# Patient Record
Sex: Female | Born: 1975 | Race: Black or African American | Hispanic: No | Marital: Married | Smoking: Current every day smoker
Health system: Southern US, Community
[De-identification: ages and names within clinical notes are randomized; demographics above are authoritative.]

## PROBLEM LIST (undated history)

## (undated) DIAGNOSIS — T7840XA Allergy, unspecified, initial encounter: Secondary | ICD-10-CM

## (undated) DIAGNOSIS — I639 Cerebral infarction, unspecified: Secondary | ICD-10-CM

## (undated) DIAGNOSIS — N83209 Unspecified ovarian cyst, unspecified side: Secondary | ICD-10-CM

## (undated) DIAGNOSIS — R011 Cardiac murmur, unspecified: Secondary | ICD-10-CM

## (undated) DIAGNOSIS — D219 Benign neoplasm of connective and other soft tissue, unspecified: Secondary | ICD-10-CM

## (undated) DIAGNOSIS — F419 Anxiety disorder, unspecified: Secondary | ICD-10-CM

## (undated) DIAGNOSIS — I10 Essential (primary) hypertension: Secondary | ICD-10-CM

## (undated) DIAGNOSIS — R7303 Prediabetes: Secondary | ICD-10-CM

## (undated) DIAGNOSIS — D649 Anemia, unspecified: Secondary | ICD-10-CM

## (undated) DIAGNOSIS — R519 Headache, unspecified: Secondary | ICD-10-CM

## (undated) DIAGNOSIS — Z8489 Family history of other specified conditions: Secondary | ICD-10-CM

## (undated) HISTORY — DX: Unspecified ovarian cyst, unspecified side: N83.209

## (undated) HISTORY — DX: Allergy, unspecified, initial encounter: T78.40XA

## (undated) HISTORY — DX: Benign neoplasm of connective and other soft tissue, unspecified: D21.9

## (undated) HISTORY — PX: TUBAL LIGATION: SHX77

## (undated) HISTORY — PX: CHOLECYSTECTOMY: SHX55

## (undated) HISTORY — DX: Headache, unspecified: R51.9

---

## 1997-09-12 ENCOUNTER — Ambulatory Visit (HOSPITAL_COMMUNITY): Admission: RE | Admit: 1997-09-12 | Discharge: 1997-09-12 | Payer: Self-pay | Admitting: *Deleted

## 1997-11-28 ENCOUNTER — Inpatient Hospital Stay (HOSPITAL_COMMUNITY): Admission: AD | Admit: 1997-11-28 | Discharge: 1997-11-28 | Payer: Self-pay | Admitting: *Deleted

## 1997-11-28 ENCOUNTER — Inpatient Hospital Stay (HOSPITAL_COMMUNITY): Admission: AD | Admit: 1997-11-28 | Discharge: 1997-11-30 | Payer: Self-pay | Admitting: *Deleted

## 2001-02-28 ENCOUNTER — Other Ambulatory Visit: Admission: RE | Admit: 2001-02-28 | Discharge: 2001-02-28 | Payer: Self-pay | Admitting: Family Medicine

## 2002-01-18 ENCOUNTER — Emergency Department (HOSPITAL_COMMUNITY): Admission: EM | Admit: 2002-01-18 | Discharge: 2002-01-18 | Payer: Self-pay | Admitting: Internal Medicine

## 2002-01-18 ENCOUNTER — Encounter: Payer: Self-pay | Admitting: Emergency Medicine

## 2002-04-18 ENCOUNTER — Ambulatory Visit (HOSPITAL_COMMUNITY): Admission: RE | Admit: 2002-04-18 | Discharge: 2002-04-18 | Payer: Self-pay | Admitting: Internal Medicine

## 2002-04-18 ENCOUNTER — Encounter: Payer: Self-pay | Admitting: Internal Medicine

## 2002-06-20 ENCOUNTER — Encounter: Payer: Self-pay | Admitting: Internal Medicine

## 2002-06-20 ENCOUNTER — Encounter (HOSPITAL_COMMUNITY): Admission: RE | Admit: 2002-06-20 | Discharge: 2002-07-20 | Payer: Self-pay | Admitting: Internal Medicine

## 2005-02-26 ENCOUNTER — Emergency Department (HOSPITAL_COMMUNITY): Admission: EM | Admit: 2005-02-26 | Discharge: 2005-02-26 | Payer: Self-pay | Admitting: Emergency Medicine

## 2008-10-01 ENCOUNTER — Ambulatory Visit (HOSPITAL_COMMUNITY): Admission: RE | Admit: 2008-10-01 | Discharge: 2008-10-01 | Payer: Self-pay | Admitting: Family Medicine

## 2008-10-02 ENCOUNTER — Encounter (HOSPITAL_COMMUNITY): Admission: RE | Admit: 2008-10-02 | Discharge: 2008-11-01 | Payer: Self-pay | Admitting: Family Medicine

## 2008-10-12 ENCOUNTER — Emergency Department (HOSPITAL_COMMUNITY): Admission: EM | Admit: 2008-10-12 | Discharge: 2008-10-12 | Payer: Self-pay | Admitting: Emergency Medicine

## 2008-10-24 ENCOUNTER — Encounter: Admission: RE | Admit: 2008-10-24 | Discharge: 2008-10-24 | Payer: Self-pay | Admitting: General Surgery

## 2009-07-28 ENCOUNTER — Emergency Department (HOSPITAL_COMMUNITY): Admission: EM | Admit: 2009-07-28 | Discharge: 2009-07-28 | Payer: Self-pay | Admitting: Emergency Medicine

## 2010-02-10 DIAGNOSIS — I639 Cerebral infarction, unspecified: Secondary | ICD-10-CM

## 2010-02-10 HISTORY — DX: Cerebral infarction, unspecified: I63.9

## 2010-02-17 ENCOUNTER — Ambulatory Visit (HOSPITAL_COMMUNITY)
Admission: RE | Admit: 2010-02-17 | Discharge: 2010-02-17 | Payer: Self-pay | Source: Home / Self Care | Admitting: General Surgery

## 2010-07-18 ENCOUNTER — Ambulatory Visit (HOSPITAL_COMMUNITY)
Admission: RE | Admit: 2010-07-18 | Discharge: 2010-07-18 | Payer: Self-pay | Source: Home / Self Care | Attending: Internal Medicine | Admitting: Internal Medicine

## 2010-08-08 ENCOUNTER — Other Ambulatory Visit: Payer: Self-pay | Admitting: Neurology

## 2010-08-08 DIAGNOSIS — R200 Anesthesia of skin: Secondary | ICD-10-CM

## 2010-08-15 ENCOUNTER — Other Ambulatory Visit: Payer: Self-pay | Admitting: Neurology

## 2010-08-15 ENCOUNTER — Ambulatory Visit (HOSPITAL_COMMUNITY)
Admission: RE | Admit: 2010-08-15 | Discharge: 2010-08-15 | Disposition: A | Discharge status: Home / Self Care | Payer: PRIVATE HEALTH INSURANCE | Source: Ambulatory Visit | Attending: Neurology | Admitting: Neurology

## 2010-08-15 DIAGNOSIS — R42 Dizziness and giddiness: Secondary | ICD-10-CM | POA: Insufficient documentation

## 2010-08-15 DIAGNOSIS — R200 Anesthesia of skin: Secondary | ICD-10-CM

## 2010-08-15 DIAGNOSIS — I6529 Occlusion and stenosis of unspecified carotid artery: Secondary | ICD-10-CM | POA: Insufficient documentation

## 2010-08-15 DIAGNOSIS — I10 Essential (primary) hypertension: Secondary | ICD-10-CM | POA: Insufficient documentation

## 2010-08-15 DIAGNOSIS — Z87891 Personal history of nicotine dependence: Secondary | ICD-10-CM | POA: Insufficient documentation

## 2010-08-15 DIAGNOSIS — R209 Unspecified disturbances of skin sensation: Secondary | ICD-10-CM | POA: Insufficient documentation

## 2010-08-15 DIAGNOSIS — I6789 Other cerebrovascular disease: Secondary | ICD-10-CM

## 2010-09-26 LAB — CBC
HCT: 32.8 % — ABNORMAL LOW (ref 36.0–46.0)
Hemoglobin: 10.4 g/dL — ABNORMAL LOW (ref 12.0–15.0)
MCHC: 31.8 g/dL (ref 30.0–36.0)
MCV: 71.3 fL — ABNORMAL LOW (ref 78.0–100.0)
RDW: 17.7 % — ABNORMAL HIGH (ref 11.5–15.5)

## 2010-09-26 LAB — BASIC METABOLIC PANEL
BUN: 10 mg/dL (ref 6–23)
CO2: 26 mEq/L (ref 19–32)
Chloride: 108 mEq/L (ref 96–112)
Glucose, Bld: 89 mg/dL (ref 70–99)
Potassium: 4.3 mEq/L (ref 3.5–5.1)
Sodium: 138 mEq/L (ref 135–145)

## 2010-09-29 LAB — DIFFERENTIAL
Basophils Absolute: 0 K/uL (ref 0.0–0.1)
Basophils Relative: 0 % (ref 0–1)
Eosinophils Absolute: 0 K/uL (ref 0.0–0.7)
Eosinophils Relative: 0 % (ref 0–5)
Lymphocytes Relative: 20 % (ref 12–46)
Lymphs Abs: 1.4 K/uL (ref 0.7–4.0)
Monocytes Absolute: 0.3 K/uL (ref 0.1–1.0)
Monocytes Relative: 4 % (ref 3–12)
Neutro Abs: 5.6 K/uL (ref 1.7–7.7)
Neutrophils Relative %: 76 % (ref 43–77)

## 2010-09-29 LAB — COMPREHENSIVE METABOLIC PANEL WITH GFR
ALT: 19 U/L (ref 0–35)
AST: 42 U/L — ABNORMAL HIGH (ref 0–37)
Albumin: 4.1 g/dL (ref 3.5–5.2)
Alkaline Phosphatase: 50 U/L (ref 39–117)
BUN: 9 mg/dL (ref 6–23)
CO2: 23 meq/L (ref 19–32)
Calcium: 9.3 mg/dL (ref 8.4–10.5)
Chloride: 106 meq/L (ref 96–112)
Creatinine, Ser: 0.85 mg/dL (ref 0.4–1.2)
GFR calc non Af Amer: >60 mL/min
Glucose, Bld: 97 mg/dL (ref 70–99)
Potassium: 3.7 meq/L (ref 3.5–5.1)
Sodium: 137 meq/L (ref 135–145)
Total Bilirubin: 0.2 mg/dL — ABNORMAL LOW (ref 0.3–1.2)
Total Protein: 7.7 g/dL (ref 6.0–8.3)

## 2010-09-29 LAB — CBC
HCT: 34.2 % — ABNORMAL LOW (ref 36.0–46.0)
Hemoglobin: 10.7 g/dL — ABNORMAL LOW (ref 12.0–15.0)
MCHC: 31.4 g/dL (ref 30.0–36.0)
MCV: 70.8 fL — ABNORMAL LOW (ref 78.0–100.0)
Platelets: 236 K/uL (ref 150–400)
RBC: 4.83 MIL/uL (ref 3.87–5.11)
RDW: 17 % — ABNORMAL HIGH (ref 11.5–15.5)
WBC: 7.3 K/uL (ref 4.0–10.5)

## 2010-09-29 LAB — LIPASE, BLOOD: Lipase: 23 U/L (ref 11–59)

## 2011-03-06 ENCOUNTER — Emergency Department (HOSPITAL_COMMUNITY): Payer: PRIVATE HEALTH INSURANCE

## 2011-03-06 ENCOUNTER — Encounter: Payer: Self-pay | Admitting: *Deleted

## 2011-03-06 ENCOUNTER — Other Ambulatory Visit: Payer: Self-pay

## 2011-03-06 ENCOUNTER — Emergency Department (HOSPITAL_COMMUNITY)
Admission: EM | Admit: 2011-03-06 | Discharge: 2011-03-06 | Disposition: A | Discharge status: Home / Self Care | Payer: PRIVATE HEALTH INSURANCE | Attending: Emergency Medicine | Admitting: Emergency Medicine

## 2011-03-06 DIAGNOSIS — I10 Essential (primary) hypertension: Secondary | ICD-10-CM | POA: Insufficient documentation

## 2011-03-06 DIAGNOSIS — Z7982 Long term (current) use of aspirin: Secondary | ICD-10-CM | POA: Insufficient documentation

## 2011-03-06 DIAGNOSIS — J40 Bronchitis, not specified as acute or chronic: Secondary | ICD-10-CM | POA: Insufficient documentation

## 2011-03-06 DIAGNOSIS — F17200 Nicotine dependence, unspecified, uncomplicated: Secondary | ICD-10-CM | POA: Insufficient documentation

## 2011-03-06 HISTORY — DX: Essential (primary) hypertension: I10

## 2011-03-06 MED ORDER — ALBUTEROL SULFATE HFA 108 (90 BASE) MCG/ACT IN AERS
2.0000 | INHALATION_SPRAY | Freq: Once | RESPIRATORY_TRACT | Status: AC
  Administered 2011-03-06: 2 via RESPIRATORY_TRACT
  Filled 2011-03-06: qty 6.7

## 2011-03-06 MED ORDER — OXYCODONE-ACETAMINOPHEN 5-325 MG PO TABS
2.0000 | ORAL_TABLET | Freq: Once | ORAL | Status: AC
  Administered 2011-03-06: 2 via ORAL
  Filled 2011-03-06: qty 2

## 2011-03-06 MED ORDER — AZITHROMYCIN 250 MG PO TABS: 250.0000 mg | ORAL_TABLET | Freq: Every day | ORAL | Status: AC

## 2011-03-06 MED ORDER — AZITHROMYCIN 250 MG PO TABS
500.0000 mg | ORAL_TABLET | Freq: Once | ORAL | Status: AC
  Administered 2011-03-06: 500 mg via ORAL
  Filled 2011-03-06: qty 2

## 2011-03-06 NOTE — ED Provider Notes (Signed)
History  Scribed for Dr. Rosalia Hammers, the patient was seen in room 12. The chart was scribed by Gilman Schmidt. The patients care was started at 2142.  CSN: [id number] Arrival date & time: 03/06/2011  7:52 PM  Chief Complaint  Patient presents with  . Chest Pain   HPI Chloe Marks is a 35 y.o. female with a history of hypertension who presents to the Emergency Department complaining of chest pain and a productive cough (yellow sputum) that began yesterday and has worsened today. The patient reports that the chest pain is exacerbated by coughing. There are no other associated symptoms and no other alleviating or aggravating factors.   PAST MEDICAL HISTORY:  Past Medical History  Diagnosis Date  . Hypertension    PAST SURGICAL HISTORY:  Past Surgical History  Procedure Date  . Cholecystectomy    MEDICATIONS:  Previous Medications   ASPIRIN (BAYER LOW STRENGTH) 81 MG EC TABLET    Take 81 mg by mouth daily.     IBUPROFEN (ADVIL,MOTRIN) 200 MG TABLET    Take 400 mg by mouth once as needed. For pain    IRON PO    Take 1 capsule by mouth daily. Poly-Iron Supplement    LISINOPRIL-HYDROCHLOROTHIAZIDE PO    Take 1 tablet by mouth daily.      ALLERGIES:  Allergies as of 03/06/2011 - Review Complete 03/06/2011  Allergen Reaction Noted  . Vicodin (hydrocodone-acetaminophen) Nausea And Vomiting and Other (See Comments) 03/06/2011     FAMILY HISTORY:  No family history on file.   SOCIAL HISTORY: History  Substance Use Topics  . Smoking status: Current Everyday Smoker  . Smokeless tobacco: Not on file  . Alcohol Use: No    Review of Systems  Respiratory: Positive for cough (yellow sputum).   Cardiovascular: Positive for chest pain.    Physical Exam  BP 130/86  Pulse 113  Temp(Src) 98.7 F (37.1 C) (Oral)  Resp 18  Ht 5\' 6"  (1.676 m)  Wt 190 lb (86.183 kg)  BMI 30.67 kg/m2  SpO2 100%  LMP 02/27/2011  Physical Exam  Constitutional: She is oriented to person, place, and time.  She appears well-developed and well-nourished.  HENT:  Head: Normocephalic.  Right Ear: External ear normal.  Left Ear: External ear normal.  Mouth/Throat: Oropharynx is clear and moist.  Eyes: Conjunctivae and EOM are normal. Pupils are equal, round, and reactive to light.  Neck: Normal range of motion. Neck supple.  Cardiovascular: Normal rate, regular rhythm, normal heart sounds and intact distal pulses.   Pulmonary/Chest: Effort normal and breath sounds normal.  Musculoskeletal: Normal range of motion.  Neurological: She is alert and oriented to person, place, and time.  Skin: Skin is warm and dry.  Psychiatric: She has a normal mood and affect. Her behavior is normal.    OTHER DATA REVIEWED: Nursing notes, vital signs, and past medical records reviewed.   DIAGNOSTIC STUDIES: Oxygen Saturation is 100% on room air, normal by my interpretation.    EKG:  Date: 03/06/2011  Dg Chest 2 View  03/06/2011  *RADIOLOGY REPORT*  Clinical Data: Cough.  CHEST - 2 VIEW  Comparison: None.  Findings: Heart and mediastinal contours are within normal limits. No focal opacities or effusions.  No acute bony abnormality.  IMPRESSION: Normal study.  Original Report Authenticated By: Cyndie Chime, M.D.  ED COURSE / COORDINATION OF CARE: 2142  Patient evaluated by ED physician, CXR, EKG ordered. Pulse Recheck- 98  MDM: Patient with chest pain only  with cough.  Patient with chills but no fever.  Cough productive of yellow sputum.    IMPRESSION: Bronchitis- plan z-pack and albuterol.   PLAN:  Discharge: The patient is to return the emergency department if there is any worsening of symptoms. I have reviewed the discharge instructions with the patient.  CONDITION ON DISCHARGE: Stable  MEDICATIONS GIVEN IN THE E.D.  Medications  LISINOPRIL-HYDROCHLOROTHIAZIDE PO (not administered)  aspirin (BAYER LOW STRENGTH) 81 MG EC tablet (not administered)  IRON PO (not administered)  ibuprofen  (ADVIL,MOTRIN) 200 MG tablet (not administered)    DISCHARGE MEDICATIONS: New Prescriptions   No medications on file    ED Course  Procedures       Hilario Quarry, MD 03/07/11 1329

## 2011-03-06 NOTE — ED Notes (Signed)
Pt reports increased productive cough with asso chest wall pain starting yesterday

## 2011-03-26 ENCOUNTER — Other Ambulatory Visit: Payer: Self-pay | Admitting: Neurology

## 2011-03-26 DIAGNOSIS — R221 Localized swelling, mass and lump, neck: Secondary | ICD-10-CM

## 2011-04-02 ENCOUNTER — Other Ambulatory Visit (HOSPITAL_COMMUNITY): Payer: PRIVATE HEALTH INSURANCE

## 2011-04-02 ENCOUNTER — Ambulatory Visit (HOSPITAL_COMMUNITY): Admission: RE | Admit: 2011-04-02 | Payer: PRIVATE HEALTH INSURANCE | Source: Ambulatory Visit

## 2011-06-10 ENCOUNTER — Observation Stay (HOSPITAL_COMMUNITY)
Admission: EM | Admit: 2011-06-10 | Discharge: 2011-06-11 | DRG: 914 | Disposition: A | Discharge status: Home / Self Care | Payer: 59 | Source: Other Acute Inpatient Hospital | Attending: Neurosurgery | Admitting: Neurosurgery

## 2011-06-10 ENCOUNTER — Emergency Department: Payer: Self-pay

## 2011-06-10 ENCOUNTER — Encounter (HOSPITAL_COMMUNITY): Payer: Self-pay | Admitting: *Deleted

## 2011-06-10 DIAGNOSIS — F17200 Nicotine dependence, unspecified, uncomplicated: Secondary | ICD-10-CM | POA: Insufficient documentation

## 2011-06-10 DIAGNOSIS — Y998 Other external cause status: Secondary | ICD-10-CM | POA: Insufficient documentation

## 2011-06-10 DIAGNOSIS — S0990XA Unspecified injury of head, initial encounter: Principal | ICD-10-CM | POA: Diagnosis present

## 2011-06-10 DIAGNOSIS — M542 Cervicalgia: Secondary | ICD-10-CM | POA: Insufficient documentation

## 2011-06-10 HISTORY — DX: Cerebral infarction, unspecified: I63.9

## 2011-06-10 LAB — MRSA PCR SCREENING: MRSA by PCR: NEGATIVE

## 2011-06-10 MED ORDER — LISINOPRIL-HYDROCHLOROTHIAZIDE 10-12.5 MG PO TABS: 1.0000 | ORAL_TABLET | Freq: Every day | ORAL | Status: DC

## 2011-06-10 MED ORDER — ONDANSETRON HCL 4 MG/2ML IJ SOLN: 4.0000 mg | Freq: Four times a day (QID) | INTRAMUSCULAR | Status: DC | PRN

## 2011-06-10 MED ORDER — HYDROCHLOROTHIAZIDE 12.5 MG PO CAPS
12.5000 mg | ORAL_CAPSULE | Freq: Every day | ORAL | Status: DC
  Filled 2011-06-10: qty 1

## 2011-06-10 MED ORDER — LISINOPRIL 10 MG PO TABS
10.0000 mg | ORAL_TABLET | Freq: Every day | ORAL | Status: DC
  Filled 2011-06-10: qty 1

## 2011-06-10 MED ORDER — ONDANSETRON HCL 4 MG PO TABS: 4.0000 mg | ORAL_TABLET | Freq: Four times a day (QID) | ORAL | Status: DC | PRN

## 2011-06-10 MED ORDER — SODIUM CHLORIDE 0.9 % IV SOLN
INTRAVENOUS | Status: DC
  Administered 2011-06-10 – 2011-06-11 (×2): via INTRAVENOUS
  Filled 2011-06-10 (×3): qty 1000

## 2011-06-10 MED ORDER — ACETAMINOPHEN 325 MG PO TABS: 650.0000 mg | ORAL_TABLET | ORAL | Status: DC | PRN

## 2011-06-10 MED ORDER — HYDROCODONE-ACETAMINOPHEN 5-325 MG PO TABS
1.0000 | ORAL_TABLET | ORAL | Status: DC | PRN
  Administered 2011-06-10 – 2011-06-11 (×3): 1 via ORAL
  Filled 2011-06-10 (×4): qty 1
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
## 2011-06-11 DIAGNOSIS — S0990XA Unspecified injury of head, initial encounter: Secondary | ICD-10-CM | POA: Diagnosis present

## 2011-06-11 MED ORDER — HYDROCODONE-ACETAMINOPHEN 5-325 MG PO TABS: 1.0000 | ORAL_TABLET | Freq: Four times a day (QID) | ORAL | Status: AC | PRN

## 2011-06-11 NOTE — Discharge Summary (Signed)
Physician Discharge Summary  Patient ID: DIA DONATE MRN: [id number] DOB/AGE: 1975/11/23 35 y.o.  Admit date: 06/10/2011 Discharge date: 06/11/2011  Admission Diagnoses:Closed Head Injury    Discharge Diagnoses: Same Active Problems:  * No active hospital problems. *    Discharged Condition: good  Hospital Course: Overnight stay. Neurologically normal. GCS 15. Complains of mild headache, improved since admission.  Consults: none  Significant Diagnostic Studies: none  Treatments: observation  Discharge Exam: Blood pressure 107/69, pulse 63, temperature 98.3 F (36.8 C), temperature source Oral, resp. rate 18, height 5\' 7"  (1.702 m), weight 84.4 kg (186 lb 1.1 oz), last menstrual period 06/10/2011, SpO2 100.00%. Head: Normocephalic, without obvious abnormality, atraumatic Eyes: conjunctivae/corneas clear. PERRL, EOM's intact. Fundi benign. Neurologic: Alert and oriented X 3, normal strength and tone. Normal symmetric reflexes. Normal coordination and gait  Disposition: Home or Self Care   Current Discharge Medication List    START taking these medications   Details  HYDROcodone-acetaminophen (NORCO) 5-325 MG per tablet Take 1 tablet by mouth every 6 (six) hours as needed for pain. Qty: 40 tablet, Refills: 0      CONTINUE these medications which have NOT CHANGED   Details  aspirin (BAYER LOW STRENGTH) 81 MG EC tablet Take 81 mg by mouth daily.      IRON PO Take 1 capsule by mouth daily. Poly-Iron Supplement     lisinopril-hydrochlorothiazide (PRINZIDE,ZESTORETIC) 10-12.5 MG per tablet Take 1 tablet by mouth daily.        STOP taking these medications     ibuprofen (ADVIL,MOTRIN) 200 MG tablet          Signed: Dorrien Grunder L 06/11/2011, 1:13 PM

## 2011-10-20 ENCOUNTER — Other Ambulatory Visit: Payer: Self-pay | Admitting: Neurology

## 2011-10-20 DIAGNOSIS — I639 Cerebral infarction, unspecified: Secondary | ICD-10-CM

## 2011-10-21 ENCOUNTER — Ambulatory Visit (HOSPITAL_COMMUNITY)
Admission: RE | Admit: 2011-10-21 | Discharge: 2011-10-21 | Disposition: A | Discharge status: Home / Self Care | Payer: 59 | Source: Ambulatory Visit | Attending: Neurology | Admitting: Neurology

## 2011-10-21 DIAGNOSIS — I6350 Cerebral infarction due to unspecified occlusion or stenosis of unspecified cerebral artery: Secondary | ICD-10-CM | POA: Insufficient documentation

## 2011-10-21 DIAGNOSIS — I639 Cerebral infarction, unspecified: Secondary | ICD-10-CM

## 2011-10-21 DIAGNOSIS — I10 Essential (primary) hypertension: Secondary | ICD-10-CM | POA: Insufficient documentation

## 2012-01-30 ENCOUNTER — Encounter (HOSPITAL_COMMUNITY): Payer: Self-pay | Admitting: *Deleted

## 2012-01-30 ENCOUNTER — Emergency Department (HOSPITAL_COMMUNITY)
Admission: EM | Admit: 2012-01-30 | Discharge: 2012-01-30 | Disposition: A | Discharge status: Home / Self Care | Payer: 59 | Attending: Emergency Medicine | Admitting: Emergency Medicine

## 2012-01-30 DIAGNOSIS — I10 Essential (primary) hypertension: Secondary | ICD-10-CM | POA: Insufficient documentation

## 2012-01-30 DIAGNOSIS — R5381 Other malaise: Secondary | ICD-10-CM | POA: Insufficient documentation

## 2012-01-30 DIAGNOSIS — N898 Other specified noninflammatory disorders of vagina: Secondary | ICD-10-CM | POA: Insufficient documentation

## 2012-01-30 DIAGNOSIS — R109 Unspecified abdominal pain: Secondary | ICD-10-CM | POA: Insufficient documentation

## 2012-01-30 DIAGNOSIS — N949 Unspecified condition associated with female genital organs and menstrual cycle: Secondary | ICD-10-CM | POA: Insufficient documentation

## 2012-01-30 DIAGNOSIS — R300 Dysuria: Secondary | ICD-10-CM | POA: Insufficient documentation

## 2012-01-30 DIAGNOSIS — R52 Pain, unspecified: Secondary | ICD-10-CM | POA: Insufficient documentation

## 2012-01-30 DIAGNOSIS — R102 Pelvic and perineal pain: Secondary | ICD-10-CM

## 2012-01-30 LAB — URINALYSIS, ROUTINE W REFLEX MICROSCOPIC
Bilirubin Urine: NEGATIVE
Glucose, UA: NEGATIVE mg/dL
Hgb urine dipstick: NEGATIVE
Protein, ur: NEGATIVE mg/dL
Urobilinogen, UA: 0.2 mg/dL (ref 0.0–1.0)

## 2012-01-30 LAB — WET PREP, GENITAL: Yeast Wet Prep HPF POC: NONE SEEN

## 2012-01-30 MED ORDER — AZITHROMYCIN 250 MG PO TABS
1000.0000 mg | ORAL_TABLET | Freq: Once | ORAL | Status: AC
  Administered 2012-01-30: 1000 mg via ORAL
  Filled 2012-01-30: qty 4

## 2012-01-30 MED ORDER — OXYCODONE-ACETAMINOPHEN 5-325 MG PO TABS
1.0000 | ORAL_TABLET | Freq: Once | ORAL | Status: AC
  Administered 2012-01-30: 1 via ORAL
  Filled 2012-01-30 (×2): qty 1

## 2012-01-30 MED ORDER — OXYCODONE-ACETAMINOPHEN 5-325 MG PO TABS: 1.0000 | ORAL_TABLET | ORAL | Status: AC | PRN

## 2012-01-30 MED ORDER — LIDOCAINE HCL (PF) 1 % IJ SOLN
INTRAMUSCULAR | Status: AC
  Administered 2012-01-30: 2.1 mL
  Filled 2012-01-30: qty 5

## 2012-01-30 MED ORDER — CEFTRIAXONE SODIUM 250 MG IJ SOLR
250.0000 mg | Freq: Once | INTRAMUSCULAR | Status: AC
  Administered 2012-01-30: 250 mg via INTRAMUSCULAR
  Filled 2012-01-30: qty 250

## 2012-01-30 NOTE — ED Notes (Addendum)
Pt reporting pain with urination.  States has had for about 2 weeks, but pain getting worse.  Reports mild nausea, no vomiting. Also reporting vaginal discharge.

## 2012-01-30 NOTE — ED Notes (Signed)
Discharge instructions given and reviewed with patient.  Prescription given for Percocet; effects and use explained.  Patient verbalized understanding to take medication as prescribed and sedating effects.  Patient also verbalized understanding to return tomorrow morning for ultrasound scheduled at 9am.

## 2012-01-30 NOTE — ED Provider Notes (Signed)
History  This chart was scribed for Flint Melter, MD by Erskine Emery. This patient was seen in room APA08/APA08 and the patient's care was started at 20:47.   CSN: [id number]  Arrival date & time 01/30/12  2002   First MD Initiated Contact with Patient 01/30/12 2047      No chief complaint on file.   (Consider location/radiation/quality/duration/timing/severity/associated sxs/prior treatment) HPI  Chloe Marks is a 36 y.o. female who presents to the Emergency Department complaining of moderate dysuria for the past 2 weeks that has been gradually worsening with associated vaginal discharge, weakness, and mild abdominal pain. Pt denies any current fever, back pain, nausea, or emesis but reports some nausea and vertigo about a week ago. Pt's LNMP was last week. Pt denies she has any concern for of STD.  Pt reports her mother drove her to the ED.    Past Medical History  Diagnosis Date  . Hypertension   . 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 Cholecystectomy   . Tubal ligation     History reviewed. No pertinent family history.  History  Substance Use Topics  . Smoking status: Current Everyday Smoker -- 0.2 packs/day for 4 years    Types: Cigarettes  . Smokeless tobacco: Not on file  . Alcohol Use: No    OB History    Grav Para Term Preterm Abortions TAB SAB Ect Mult Living                  Review of Systems  Constitutional: Negative for fever and chills.  Respiratory: Negative for cough and shortness of breath.   Gastrointestinal: Positive for abdominal pain. Negative for nausea and vomiting.  Genitourinary: Positive for dysuria and vaginal discharge. Negative for vaginal bleeding.  Neurological: Positive for weakness.    Allergies  Vicodin  Home Medications   Current Outpatient Rx  Name Route Sig Dispense Refill  . ASPIRIN 81 MG PO TBEC Oral Take 81 mg by mouth daily.      Marland Kitchen POLYSACCHARIDE  IRON COMPLEX 150 MG PO CAPS Oral Take 150 mg by mouth daily.    Marland Kitchen LISINOPRIL-HYDROCHLOROTHIAZIDE 10-12.5 MG PO TABS Oral Take 1 tablet by mouth daily.      Marland Kitchen MECLIZINE HCL 25 MG PO TABS Oral Take 25 mg by mouth as needed. Dizziness    . OXYCODONE-ACETAMINOPHEN 5-325 MG PO TABS Oral Take 1 tablet by mouth every 4 (four) hours as needed for pain. 15 tablet 0    Triage Vitals: BP 120/73  Pulse 72  Temp 98 F (36.7 C) (Oral)  Resp 18  Ht 5\' 7"  (1.702 m)  Wt 181 lb (82.101 kg)  BMI 28.35 kg/m2  SpO2 100%  LMP 01/23/2012  Physical Exam  Nursing note and vitals reviewed. Constitutional: She is oriented to person, place, and time. She appears well-developed and well-nourished.  HENT:  Head: Normocephalic and atraumatic.  Mouth/Throat: Oropharynx is clear and moist.  Eyes: Conjunctivae and EOM are normal.  Neck: Phonation normal.  Cardiovascular: Normal rate, regular rhythm and normal heart sounds.   No murmur heard. Pulmonary/Chest: Effort normal and breath sounds normal. She exhibits no bony tenderness.  Abdominal: Soft. Normal appearance and bowel sounds are normal. There is tenderness.       Moderate RLQ tenderness  Genitourinary: Uterus is tender. Uterus is not enlarged. Cervix exhibits friability. Cervix exhibits no motion tenderness. Discharge: white. Right adnexum displays  tenderness. Right adnexum displays no mass. Left adnexum displays no mass and no tenderness. Vaginal discharge (white) found.       Small amount of white vaginal discharge. Unable to palpate either ovary.    Musculoskeletal: Normal range of motion.       No CVAT  Neurological: She is alert and oriented to person, place, and time. She has normal strength. No cranial nerve deficit or sensory deficit. She exhibits normal muscle tone. Coordination normal.  Skin: Skin is warm, dry and intact.  Psychiatric: She has a normal mood and affect. Her behavior is normal. Judgment and thought content normal.    ED Course    Procedures (including critical care time)  DIAGNOSTIC STUDIES: Oxygen Saturation is 100% on room air, normal by my interpretation.    COORDINATION OF CARE: 20:55--I evaluated the patient and we discussed a treatment plan including a pelvic exam and some pain medication to which the pt agreed.   21:00--Medication order: oxycodone-acetaminophen (Percocet/Roxicet) 5-325 mg per tablet, 1 tablet--once  21:40--I performed a pelvic examination and notified the pt that her symptoms may be due to an STD and that I would prescribe her some Zithromax and Rocephin. I let her know that we would run tests and notify her of the results in a few days.   Labs Reviewed  URINALYSIS, ROUTINE W REFLEX MICROSCOPIC - Abnormal; Notable for the following:    APPearance HAZY (*)     All other components within normal limits  WET PREP, GENITAL - Abnormal; Notable for the following:    Trich, Wet Prep MANY (*)     Clue Cells Wet Prep HPF POC MANY (*)     WBC, Wet Prep HPF POC MANY (*)     All other components within normal limits  PREGNANCY, URINE  GC/CHLAMYDIA PROBE AMP, GENITAL   No results found.   1. Acute pelvic pain, female       MDM   Nonspecific pelvic pain. Patient has been treated for STD. Doubt PID, or systemic illness. Possible right ovarian process, they can be evaluated as an outpatient with an ultrasound.    Plan: Home Medications- Percocet; Home Treatments- rest; Recommended follow up- U/S in morning  I personally performed the services described in this documentation, which was scribed in my presence. The recorded information has been reviewed and considered.           Flint Melter, MD 01/31/12 [phone]

## 2012-01-31 ENCOUNTER — Ambulatory Visit (HOSPITAL_COMMUNITY)
Admit: 2012-01-31 | Discharge: 2012-01-31 | Disposition: A | Discharge status: Home / Self Care | Payer: 59 | Source: Ambulatory Visit | Attending: Emergency Medicine | Admitting: Emergency Medicine

## 2012-01-31 ENCOUNTER — Other Ambulatory Visit (HOSPITAL_COMMUNITY): Payer: Self-pay | Admitting: Emergency Medicine

## 2012-01-31 DIAGNOSIS — R102 Pelvic and perineal pain: Secondary | ICD-10-CM

## 2012-01-31 DIAGNOSIS — N949 Unspecified condition associated with female genital organs and menstrual cycle: Secondary | ICD-10-CM | POA: Insufficient documentation

## 2012-01-31 DIAGNOSIS — R1031 Right lower quadrant pain: Secondary | ICD-10-CM | POA: Insufficient documentation

## 2012-01-31 DIAGNOSIS — R9389 Abnormal findings on diagnostic imaging of other specified body structures: Secondary | ICD-10-CM | POA: Insufficient documentation

## 2013-02-13 ENCOUNTER — Emergency Department (HOSPITAL_COMMUNITY)
Admission: EM | Admit: 2013-02-13 | Discharge: 2013-02-13 | Disposition: A | Discharge status: Home / Self Care | Payer: Self-pay | Attending: Emergency Medicine | Admitting: Emergency Medicine

## 2013-02-13 ENCOUNTER — Encounter (HOSPITAL_COMMUNITY): Payer: Self-pay | Admitting: Emergency Medicine

## 2013-02-13 DIAGNOSIS — S51812A Laceration without foreign body of left forearm, initial encounter: Secondary | ICD-10-CM

## 2013-02-13 DIAGNOSIS — Y929 Unspecified place or not applicable: Secondary | ICD-10-CM | POA: Insufficient documentation

## 2013-02-13 DIAGNOSIS — W268XXA Contact with other sharp object(s), not elsewhere classified, initial encounter: Secondary | ICD-10-CM | POA: Insufficient documentation

## 2013-02-13 DIAGNOSIS — F17200 Nicotine dependence, unspecified, uncomplicated: Secondary | ICD-10-CM | POA: Insufficient documentation

## 2013-02-13 DIAGNOSIS — I10 Essential (primary) hypertension: Secondary | ICD-10-CM | POA: Insufficient documentation

## 2013-02-13 DIAGNOSIS — Z79899 Other long term (current) drug therapy: Secondary | ICD-10-CM | POA: Insufficient documentation

## 2013-02-13 DIAGNOSIS — Z7982 Long term (current) use of aspirin: Secondary | ICD-10-CM | POA: Insufficient documentation

## 2013-02-13 DIAGNOSIS — Z8673 Personal history of transient ischemic attack (TIA), and cerebral infarction without residual deficits: Secondary | ICD-10-CM | POA: Insufficient documentation

## 2013-02-13 DIAGNOSIS — S51809A Unspecified open wound of unspecified forearm, initial encounter: Secondary | ICD-10-CM | POA: Insufficient documentation

## 2013-02-13 DIAGNOSIS — Y9389 Activity, other specified: Secondary | ICD-10-CM | POA: Insufficient documentation

## 2013-02-13 NOTE — ED Notes (Signed)
States that she cut her left arm yesterday around 6 pm, states that she thinks it may need stiches.  States that she cut her arm with glass.

## 2013-02-13 NOTE — ED Provider Notes (Signed)
Medical screening examination/treatment/procedure(s) were performed by non-physician practitioner and as supervising physician I was immediately available for consultation/collaboration.   Stasia Somero, MD 02/13/13 1506 

## 2013-02-13 NOTE — ED Notes (Signed)
Superficial lac to lt forearm , cut on broken glass. Injury occurred yesterday.

## 2013-02-13 NOTE — ED Provider Notes (Signed)
CSN: [id number]     Arrival date & time 02/13/13  1318 History     First MD Initiated Contact with Patient 02/13/13 1333     Chief Complaint  Patient presents with  . Extremity Laceration   (Consider location/radiation/quality/duration/timing/severity/associated sxs/prior Treatment) HPI Comments: Chloe Marks is a 37 y.o. Female presenting with a laceration to her left forearm which occurred 20 hours ago when her drinking glass broke.  She has cleaned the wound with water and applied neosporin ointment. There was initial bleeding which has improved. The wound is gaping and is concerned she may need stitches.  Her tetanus is utd.     The history is provided by the patient.    Past Medical History  Diagnosis Date  . Hypertension   . 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 Cholecystectomy    . Tubal ligation     No family history on file. History  Substance Use Topics  . Smoking status: Current Every Day Smoker -- 0.25 packs/day for 4 years    Types: Cigarettes  . Smokeless tobacco: Not on file  . Alcohol Use: No   OB History   Grav Para Term Preterm Abortions TAB SAB Ect Mult Living                 Review of Systems  Constitutional: Negative for fever and chills.  HENT: Negative for facial swelling.   Respiratory: Negative for shortness of breath and wheezing.   Skin: Positive for wound.  Neurological: Negative for numbness.    Allergies  Vicodin  Home Medications   Current Outpatient Rx  Name  Route  Sig  Dispense  Refill  . aspirin (BAYER LOW STRENGTH) 81 MG EC tablet   Oral   Take 81 mg by mouth daily.           Marland Kitchen ibuprofen (ADVIL,MOTRIN) 200 MG tablet   Oral   Take 400 mg by mouth every 6 (six) hours as needed for pain.         . iron polysaccharides (NIFEREX) 150 MG capsule   Oral   Take 150 mg by mouth daily.         Marland Kitchen lisinopril-hydrochlorothiazide (PRINZIDE,ZESTORETIC)  10-12.5 MG per tablet   Oral   Take 1 tablet by mouth daily.           . meclizine (ANTIVERT) 25 MG tablet   Oral   Take 25 mg by mouth as needed for dizziness.           BP 142/101  Pulse 63  Temp(Src) 98.2 F (36.8 C) (Oral)  Resp 18  Ht 5\' 7"  (1.702 m)  Wt 191 lb (86.637 kg)  BMI 29.91 kg/m2  SpO2 100%  LMP 02/05/2013 Physical Exam  Constitutional: She is oriented to person, place, and time. She appears well-developed and well-nourished.  HENT:  Head: Normocephalic.  Cardiovascular: Normal rate.   Pulmonary/Chest: Effort normal.  Musculoskeletal: She exhibits no tenderness.  Neurological: She is alert and oriented to person, place, and time. She has normal strength. No sensory deficit.  Skin: Laceration noted.  2 cm laceration left volar forearm,  Superficial,  Hemostatic.  No palpable fb,  Can clearly see base of wound.    ED Course   Procedures (including critical care time)  LACERATION REPAIR Performed by: Burgess Amor Authorized by: Burgess Amor Consent: Verbal consent obtained. Risks and benefits: risks, benefits  and alternatives were discussed Consent given by: patient Patient identity confirmed: provided demographic data Prepped and Draped in normal sterile fashion Wound explored  Laceration Location: left forearm  Laceration Length: 2cm  No Foreign Bodies seen or palpated  Anesthesia: none Local anesthetic: none  Anesthetic total: none  Irrigation method: syringe with ns after betadine Amount of cleaning: standard  Skin closure: #3 sterile strips  Number of sutures: na  Technique: sterile strips.  Patient tolerance: Patient tolerated the procedure well with no immediate complications.   Labs Reviewed - No data to display No results found. 1. Laceration of forearm, left, initial encounter     MDM  Discussed sutures vs sterile strips.  Pt opted for sterile strips,  Less likely to complicate possible infection given this is an old  wound.  Tolerated well.  PRN f/u anticipated.  Burgess Amor, PA-C 02/13/13 1414

## 2013-12-05 ENCOUNTER — Encounter (HOSPITAL_COMMUNITY): Payer: Self-pay | Admitting: Emergency Medicine

## 2013-12-05 ENCOUNTER — Emergency Department (HOSPITAL_COMMUNITY)
Admission: EM | Admit: 2013-12-05 | Discharge: 2013-12-05 | Disposition: A | Discharge status: Home / Self Care | Payer: Self-pay | Attending: Emergency Medicine | Admitting: Emergency Medicine

## 2013-12-05 DIAGNOSIS — Z7982 Long term (current) use of aspirin: Secondary | ICD-10-CM | POA: Insufficient documentation

## 2013-12-05 DIAGNOSIS — B351 Tinea unguium: Secondary | ICD-10-CM | POA: Insufficient documentation

## 2013-12-05 DIAGNOSIS — Z8673 Personal history of transient ischemic attack (TIA), and cerebral infarction without residual deficits: Secondary | ICD-10-CM | POA: Insufficient documentation

## 2013-12-05 DIAGNOSIS — F17200 Nicotine dependence, unspecified, uncomplicated: Secondary | ICD-10-CM | POA: Insufficient documentation

## 2013-12-05 DIAGNOSIS — Z79899 Other long term (current) drug therapy: Secondary | ICD-10-CM | POA: Insufficient documentation

## 2013-12-05 DIAGNOSIS — Z791 Long term (current) use of non-steroidal anti-inflammatories (NSAID): Secondary | ICD-10-CM | POA: Insufficient documentation

## 2013-12-05 DIAGNOSIS — I10 Essential (primary) hypertension: Secondary | ICD-10-CM | POA: Insufficient documentation

## 2013-12-05 MED ORDER — GRISEOFULVIN MICROSIZE 500 MG PO TABS: 500.0000 mg | ORAL_TABLET | Freq: Every day | ORAL | Status: DC

## 2013-12-05 MED ORDER — OXYCODONE-ACETAMINOPHEN 5-325 MG PO TABS
1.0000 | ORAL_TABLET | Freq: Once | ORAL | Status: AC
  Administered 2013-12-05: 1 via ORAL
  Filled 2013-12-05: qty 1

## 2013-12-05 MED ORDER — OXYCODONE-ACETAMINOPHEN 5-325 MG PO TABS: 1.0000 | ORAL_TABLET | Freq: Four times a day (QID) | ORAL | Status: DC | PRN

## 2013-12-05 MED ORDER — CEPHALEXIN 500 MG PO CAPS: 500.0000 mg | ORAL_CAPSULE | Freq: Three times a day (TID) | ORAL | Status: DC

## 2013-12-05 NOTE — ED Notes (Signed)
Rt great toenail came off,  Pt says she has noticed yellowing of nail.over last few weeks.

## 2013-12-05 NOTE — Discharge Instructions (Signed)
Ringworm, Nail A fungal infection of the nail (tinea unguium/onychomycosis) is common. It is common as the visible part of the nail is composed of dead cells which have no blood supply to help prevent infection. It occurs because fungi are everywhere and will pick any opportunity to grow on any dead material. Because nails are very slow growing they require up to 2 years of treatment with anti-fungal medications. The entire nail back to the base is infected. This includes approximately  of the nail which you cannot see. If your caregiver has prescribed a medication by mouth, take it every day and as directed. No progress will be seen for at least 6 to 9 months. Do not be disappointed! Because fungi live on dead cells with little or no exposure to blood supply, medication delivery to the infection is slow; thus the cure is slow. It is also why you can observe no progress in the first 6 months. The nail becoming cured is the base of the nail, as it has the blood supply. Topical medication such as creams and ointments are usually not effective. Important in successful treatment of nail fungus is closely following the medication regimen that your doctor prescribes. Sometimes you and your caregiver may elect to speed up this process by surgical removal of all the nails. Even this may still require 6 to 9 months of additional oral medications. See your caregiver as directed. Remember there will be no visible improvement for at least 6 months. See your caregiver sooner if other signs of infection (redness and swelling) develop. Document Released: 06/26/2000 Document Revised: 09/21/2011 Document Reviewed: 09/04/2008 [employer] Patient Information 2014 [employer].  

## 2013-12-05 NOTE — ED Notes (Signed)
Pt c/o pain in right great toe. Pt states "my toenail was ripped off".

## 2013-12-07 NOTE — ED Provider Notes (Signed)
CSN: [id number]     Arrival date & time 12/05/13  1831 History   First MD Initiated Contact with Patient 12/05/13 1849     Chief Complaint  Patient presents with  . Toe Pain     (Consider location/radiation/quality/duration/timing/severity/associated sxs/prior Treatment) Patient is a 38 y.o. female presenting with toe pain. The history is provided by the patient.  Toe Pain This is a chronic problem. The current episode started 1 to 4 weeks ago. The problem occurs constantly. The problem has been gradually worsening. Associated symptoms include arthralgias. Pertinent negatives include no chills, fever, joint swelling, numbness, rash or weakness. The symptoms are aggravated by walking. She has tried nothing for the symptoms. The treatment provided no relief.   patient reports hx of continued deterioration of her right great toe nail.  She states that she nail has become discolored and "breaking off" for weeks.  She states that recently a large piece of the nail broke off and now her toe feels "sore" when she stands.  She also reports going to different nail salons to have pedicures.  She denies swelling, numbness, redness or red streaks, bleeding or drainage from her toe.    Past Medical History  Diagnosis Date  . Hypertension   . 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 Cholecystectomy    . Tubal ligation     History reviewed. No pertinent family history. History  Substance Use Topics  . Smoking status: Current Every Day Smoker -- 0.25 packs/day for 4 years    Types: Cigarettes  . Smokeless tobacco: Not on file  . Alcohol Use: No   OB History   Grav Para Term Preterm Abortions TAB SAB Ect Mult Living                 Review of Systems  Constitutional: Negative for fever and chills.  Genitourinary: Negative for dysuria and difficulty urinating.  Musculoskeletal: Positive for arthralgias. Negative for joint swelling.   Skin: Negative for color change, rash and wound.  Neurological: Negative for weakness and numbness.  All other systems reviewed and are negative.     Allergies  Vicodin  Home Medications   Prior to Admission medications   Medication Sig Start Date End Date Taking? Authorizing Provider  aspirin EC 81 MG tablet Take 81 mg by mouth daily.   Yes Historical Provider, MD  docusate sodium (COLACE) 100 MG capsule Take 100 mg by mouth daily.   Yes Historical Provider, MD  iron polysaccharides (NIFEREX) 150 MG capsule Take 150 mg by mouth daily.   Yes Historical Provider, MD  lisinopril-hydrochlorothiazide (PRINZIDE,ZESTORETIC) 10-12.5 MG per tablet Take 1 tablet by mouth daily.     Yes Historical Provider, MD  naproxen sodium (ALEVE) 220 MG tablet Take 220-440 mg by mouth daily as needed (for pain).   Yes Historical Provider, MD  sertraline (ZOLOFT) 100 MG tablet Take 100 mg by mouth every evening.   Yes Historical Provider, MD  cephALEXin (KEFLEX) 500 MG capsule Take 1 capsule (500 mg total) by mouth 3 (three) times daily. For 10 days 12/05/13   Sylvan Sookdeo L. Mitra Duling, PA-C  griseofulvin (GRIFULVIN V) 500 MG tablet Take 1 tablet (500 mg total) by mouth daily. 12/05/13   Ariele Vidrio L. Kortne All, PA-C  oxyCODONE-acetaminophen (PERCOCET/ROXICET) 5-325 MG per tablet Take 1 tablet by mouth every 6 (six) hours as needed for severe pain. 12/05/13   Daneya Hartgrove L. Priscella Donna, PA-C  BP 127/84  Pulse 71  Temp(Src) 98.2 F (36.8 C) (Oral)  Resp 18  SpO2 100% Physical Exam  Nursing note and vitals reviewed. Constitutional: She is oriented to person, place, and time. She appears well-developed and well-nourished. No distress.  HENT:  Head: Normocephalic and atraumatic.  Cardiovascular: Normal rate, regular rhythm, normal heart sounds and intact distal pulses.   No murmur heard. Pulmonary/Chest: Effort normal and breath sounds normal. No respiratory distress.  Musculoskeletal: Normal range of motion. She exhibits  tenderness. She exhibits no edema.  Yellow, thickened ,brittle,  partial nail present to the right great toe.  ROM is preserved.  DP pulse is brisk,distal sensation intact.  No erythema, edema, bleeding, drainage, or bony deformity.  No proximal tenderness. No lymphangitis. Remaining toes appear nml, but nails are covered with polish  Neurological: She is alert and oriented to person, place, and time. She exhibits normal muscle tone. Coordination normal.  Skin: Skin is warm and dry.    ED Course  Procedures (including critical care time) Labs Review Labs Reviewed - No data to display  Imaging Review No results found.   EKG Interpretation None      MDM   Final diagnoses:  Onychomycosis of toenail    Pt is well appearing, ambulates with a steady gait.  Sx's appear chronic and c/w fungus of the nail.  No evidence or hx of acute traumatic injury to the nail or toe.    Pt agrees to post op shoe, dressing applied for comfort and podiatry f/u given.  I will begin griseofulvin rx with pt 's understanding that she will likely need treatment for several months and that i am giving enough medication to begin until she can arrange f/u.  She verbalized understanding and agrees to plan.  She appears stable for d/c    Joleah Kosak L. Vanessa Fairview, PA-C 12/07/13 1914

## 2013-12-08 NOTE — ED Provider Notes (Signed)
Medical screening examination/treatment/procedure(s) were performed by non-physician practitioner and as supervising physician I was immediately available for consultation/collaboration.  Richarda Blade, MD 12/08/13 2127

## 2014-04-28 ENCOUNTER — Encounter (HOSPITAL_COMMUNITY): Payer: Self-pay | Admitting: Emergency Medicine

## 2014-04-28 ENCOUNTER — Emergency Department (HOSPITAL_COMMUNITY)
Admission: EM | Admit: 2014-04-28 | Discharge: 2014-04-28 | Disposition: A | Discharge status: Home / Self Care | Payer: Medicaid Other | Attending: Emergency Medicine | Admitting: Emergency Medicine

## 2014-04-28 DIAGNOSIS — Z7982 Long term (current) use of aspirin: Secondary | ICD-10-CM | POA: Diagnosis not present

## 2014-04-28 DIAGNOSIS — K088 Other specified disorders of teeth and supporting structures: Secondary | ICD-10-CM | POA: Diagnosis present

## 2014-04-28 DIAGNOSIS — Z8673 Personal history of transient ischemic attack (TIA), and cerebral infarction without residual deficits: Secondary | ICD-10-CM | POA: Insufficient documentation

## 2014-04-28 DIAGNOSIS — I10 Essential (primary) hypertension: Secondary | ICD-10-CM | POA: Diagnosis not present

## 2014-04-28 DIAGNOSIS — Z792 Long term (current) use of antibiotics: Secondary | ICD-10-CM | POA: Diagnosis not present

## 2014-04-28 DIAGNOSIS — K0520 Aggressive periodontitis, unspecified: Secondary | ICD-10-CM | POA: Insufficient documentation

## 2014-04-28 DIAGNOSIS — Z791 Long term (current) use of non-steroidal anti-inflammatories (NSAID): Secondary | ICD-10-CM | POA: Insufficient documentation

## 2014-04-28 DIAGNOSIS — Z720 Tobacco use: Secondary | ICD-10-CM | POA: Insufficient documentation

## 2014-04-28 DIAGNOSIS — E119 Type 2 diabetes mellitus without complications: Secondary | ICD-10-CM | POA: Insufficient documentation

## 2014-04-28 DIAGNOSIS — Z79899 Other long term (current) drug therapy: Secondary | ICD-10-CM | POA: Insufficient documentation

## 2014-04-28 MED ORDER — TRAMADOL HCL 50 MG PO TABS: 50.0000 mg | ORAL_TABLET | Freq: Four times a day (QID) | ORAL | Status: DC | PRN

## 2014-04-28 MED ORDER — AMOXICILLIN 500 MG PO CAPS: 500.0000 mg | ORAL_CAPSULE | Freq: Three times a day (TID) | ORAL | Status: DC

## 2014-04-28 NOTE — ED Provider Notes (Signed)
CSN: [id number]     Arrival date & time 04/28/14  1631 History   First MD Initiated Contact with Patient 04/28/14 1643     Chief Complaint  Patient presents with  . Dental Pain    left lower back tooth     (Consider location/radiation/quality/duration/timing/severity/associated sxs/prior Treatment) Patient is a 38 y.o. female presenting with tooth pain. The history is provided by the patient.  Dental Pain Location:  Lower Lower teeth location:  17/LL 3rd molar Quality:  Aching and throbbing Severity:  Severe Onset quality:  Gradual Duration:  3 days Timing:  Constant Progression:  Worsening Chronicity:  New Context: abscess   Worsened by:  Nothing tried Ineffective treatments:  Acetaminophen, NSAIDs and topical anesthetic gel Associated symptoms: facial pain    Chloe Marks is a 38 y.o. female who presents to the ED with dental pain that started 3 days ago. She has been taking every thing over the counter that she can think of without relief. The pain has increased and so today she decided to come to the ED.   Past Medical History  Diagnosis Date  . Hypertension   . 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 Diabetes mellitus without complication    Past Surgical History  Procedure Laterality Date  . Cholecystectomy    . Tubal ligation     History reviewed. No pertinent family history. History  Substance Use Topics  . Smoking status: Current Every Day Smoker -- 0.25 packs/day for 4 years    Types: Cigarettes  . Smokeless tobacco: Not on file  . Alcohol Use: No   OB History   Grav Para Term Preterm Abortions TAB SAB Ect Mult Living                 Review of Systems Negative except as stated in HPI   Allergies  Vicodin  Home Medications   Prior to Admission medications   Medication Sig Start Date End Date Taking? Authorizing Provider  aspirin EC 81 MG tablet Take 81 mg by mouth daily.    Historical Provider, MD  cephALEXin (KEFLEX) 500  MG capsule Take 1 capsule (500 mg total) by mouth 3 (three) times daily. For 10 days 12/05/13   Tammy L. Triplett, PA-C  docusate sodium (COLACE) 100 MG capsule Take 100 mg by mouth daily.    Historical Provider, MD  griseofulvin (GRIFULVIN V) 500 MG tablet Take 1 tablet (500 mg total) by mouth daily. 12/05/13   Tammy L. Triplett, PA-C  iron polysaccharides (NIFEREX) 150 MG capsule Take 150 mg by mouth daily.    Historical Provider, MD  lisinopril-hydrochlorothiazide (PRINZIDE,ZESTORETIC) 10-12.5 MG per tablet Take 1 tablet by mouth daily.      Historical Provider, MD  naproxen sodium (ALEVE) 220 MG tablet Take 220-440 mg by mouth daily as needed (for pain).    Historical Provider, MD  oxyCODONE-acetaminophen (PERCOCET/ROXICET) 5-325 MG per tablet Take 1 tablet by mouth every 6 (six) hours as needed for severe pain. 12/05/13   Tammy L. Triplett, PA-C  sertraline (ZOLOFT) 100 MG tablet Take 100 mg by mouth every evening.    Historical Provider, MD   BP 146/75  Pulse 82  Temp(Src) 98.5 F (36.9 C) (Oral)  Resp 20  Ht 5\' 7"  (1.702 m)  Wt 217 lb 11.2 oz (98.748 kg)  BMI 34.09 kg/m2  SpO2 99%  LMP 03/28/2014 Physical Exam  Nursing note and vitals reviewed. Constitutional: She is oriented to person, place,  and time. She appears well-developed and well-nourished.  HENT:  Head: Normocephalic.  Mouth/Throat: Uvula is midline, oropharynx is clear and moist and mucous membranes are normal.    The left lower 3rd molar is partially erupted. The gum surrounding the tooth is swollen with erythema and tender on exam.   Eyes: EOM are normal.  Neck: Neck supple.  Cardiovascular: Normal rate.   Pulmonary/Chest: Effort normal.  Musculoskeletal: Normal range of motion.  Lymphadenopathy:    She has cervical adenopathy.  Neurological: She is alert and oriented to person, place, and time. No cranial nerve deficit.  Skin: Skin is warm and dry.  Psychiatric: She has a normal mood and affect. Her behavior is  normal.    ED Course  Procedures (including critical care time) Labs Review  MDM  38 y.o. female with dental pain due to eruption of the left lower third molar. Will treat for pain and infection and the patient will follow up with a dentist as soon as possible. She will return here as needed for any problems.    Medication List    TAKE these medications       amoxicillin 500 MG capsule  Commonly known as:  AMOXIL  Take 1 capsule (500 mg total) by mouth 3 (three) times daily.     traMADol 50 MG tablet  Commonly known as:  ULTRAM  Take 1 tablet (50 mg total) by mouth every 6 (six) hours as needed.      ASK your doctor about these medications       ALEVE 220 MG tablet  Generic drug:  naproxen sodium  Take 220-440 mg by mouth daily as needed (for pain).     aspirin EC 81 MG tablet  Take 81 mg by mouth daily.     cephALEXin 500 MG capsule  Commonly known as:  KEFLEX  Take 1 capsule (500 mg total) by mouth 3 (three) times daily. For 10 days     docusate sodium 100 MG capsule  Commonly known as:  COLACE  Take 100 mg by mouth daily.     griseofulvin 500 MG tablet  Commonly known as:  GRIFULVIN V  Take 1 tablet (500 mg total) by mouth daily.     iron polysaccharides 150 MG capsule  Commonly known as:  NIFEREX  Take 150 mg by mouth daily.     lisinopril-hydrochlorothiazide 10-12.5 MG per tablet  Commonly known as:  PRINZIDE,ZESTORETIC  Take 1 tablet by mouth daily.     oxyCODONE-acetaminophen 5-325 MG per tablet  Commonly known as:  PERCOCET/ROXICET  Take 1 tablet by mouth every 6 (six) hours as needed for severe pain.     sertraline 100 MG tablet  Commonly known as:  ZOLOFT  Take 100 mg by mouth every evening.           Ashley Mur[place], [place] 04/28/14 [phone]

## 2014-04-28 NOTE — Discharge Instructions (Signed)
Continue to take the Advil and see a dentist as soon as possible.

## 2014-04-28 NOTE — ED Notes (Signed)
Pt states she has pain in the left lower back side of jaw, pain hurts into her ear, states has had similar problems with this tooth before.

## 2014-04-29 NOTE — ED Provider Notes (Signed)
Medical screening examination/treatment/procedure(s) were performed by non-physician practitioner and as supervising physician I was immediately available for consultation/collaboration.   Orpah Greek, MD 04/29/14 [phone]

## 2014-07-25 ENCOUNTER — Emergency Department (HOSPITAL_COMMUNITY)
Admission: EM | Admit: 2014-07-25 | Discharge: 2014-07-25 | Disposition: A | Discharge status: Home / Self Care | Payer: Medicaid Other | Attending: Emergency Medicine | Admitting: Emergency Medicine

## 2014-07-25 ENCOUNTER — Emergency Department (HOSPITAL_COMMUNITY): Payer: Medicaid Other

## 2014-07-25 ENCOUNTER — Encounter (HOSPITAL_COMMUNITY): Payer: Self-pay | Admitting: *Deleted

## 2014-07-25 DIAGNOSIS — W1839XA Other fall on same level, initial encounter: Secondary | ICD-10-CM | POA: Insufficient documentation

## 2014-07-25 DIAGNOSIS — Z3202 Encounter for pregnancy test, result negative: Secondary | ICD-10-CM | POA: Insufficient documentation

## 2014-07-25 DIAGNOSIS — Y998 Other external cause status: Secondary | ICD-10-CM | POA: Insufficient documentation

## 2014-07-25 DIAGNOSIS — Z720 Tobacco use: Secondary | ICD-10-CM | POA: Diagnosis not present

## 2014-07-25 DIAGNOSIS — Z792 Long term (current) use of antibiotics: Secondary | ICD-10-CM | POA: Diagnosis not present

## 2014-07-25 DIAGNOSIS — S79911A Unspecified injury of right hip, initial encounter: Secondary | ICD-10-CM | POA: Insufficient documentation

## 2014-07-25 DIAGNOSIS — Z79899 Other long term (current) drug therapy: Secondary | ICD-10-CM | POA: Diagnosis not present

## 2014-07-25 DIAGNOSIS — S62306A Unspecified fracture of fifth metacarpal bone, right hand, initial encounter for closed fracture: Secondary | ICD-10-CM

## 2014-07-25 DIAGNOSIS — Z8673 Personal history of transient ischemic attack (TIA), and cerebral infarction without residual deficits: Secondary | ICD-10-CM | POA: Insufficient documentation

## 2014-07-25 DIAGNOSIS — R079 Chest pain, unspecified: Secondary | ICD-10-CM | POA: Diagnosis not present

## 2014-07-25 DIAGNOSIS — I10 Essential (primary) hypertension: Secondary | ICD-10-CM | POA: Insufficient documentation

## 2014-07-25 DIAGNOSIS — E119 Type 2 diabetes mellitus without complications: Secondary | ICD-10-CM | POA: Insufficient documentation

## 2014-07-25 DIAGNOSIS — R110 Nausea: Secondary | ICD-10-CM | POA: Insufficient documentation

## 2014-07-25 DIAGNOSIS — Y92002 Bathroom of unspecified non-institutional (private) residence single-family (private) house as the place of occurrence of the external cause: Secondary | ICD-10-CM | POA: Insufficient documentation

## 2014-07-25 DIAGNOSIS — S62341A Nondisplaced fracture of base of second metacarpal bone. left hand, initial encounter for closed fracture: Secondary | ICD-10-CM | POA: Insufficient documentation

## 2014-07-25 DIAGNOSIS — Y9389 Activity, other specified: Secondary | ICD-10-CM | POA: Insufficient documentation

## 2014-07-25 DIAGNOSIS — R55 Syncope and collapse: Secondary | ICD-10-CM | POA: Insufficient documentation

## 2014-07-25 DIAGNOSIS — S6991XA Unspecified injury of right wrist, hand and finger(s), initial encounter: Secondary | ICD-10-CM | POA: Diagnosis present

## 2014-07-25 DIAGNOSIS — Z7982 Long term (current) use of aspirin: Secondary | ICD-10-CM | POA: Diagnosis not present

## 2014-07-25 DIAGNOSIS — W19XXXA Unspecified fall, initial encounter: Secondary | ICD-10-CM

## 2014-07-25 LAB — CBC WITH DIFFERENTIAL/PLATELET
BASOS ABS: 0 10*3/uL (ref 0.0–0.1)
Basophils Relative: 0 % (ref 0–1)
Eosinophils Absolute: 0 10*3/uL (ref 0.0–0.7)
Eosinophils Relative: 0 % (ref 0–5)
HCT: 29.9 % — ABNORMAL LOW (ref 36.0–46.0)
Hemoglobin: 9.7 g/dL — ABNORMAL LOW (ref 12.0–15.0)
Lymphocytes Relative: 43 % (ref 12–46)
Lymphs Abs: 2 10*3/uL (ref 0.7–4.0)
MCH: 22.8 pg — ABNORMAL LOW (ref 26.0–34.0)
MCHC: 32.4 g/dL (ref 30.0–36.0)
MCV: 70.4 fL — ABNORMAL LOW (ref 78.0–100.0)
MONO ABS: 0.3 10*3/uL (ref 0.1–1.0)
Monocytes Relative: 6 % (ref 3–12)
Neutro Abs: 2.4 10*3/uL (ref 1.7–7.7)
Neutrophils Relative %: 51 % (ref 43–77)
Platelets: 278 10*3/uL (ref 150–400)
RBC: 4.25 MIL/uL (ref 3.87–5.11)
RDW: 17.1 % — AB (ref 11.5–15.5)
WBC: 4.8 10*3/uL (ref 4.0–10.5)

## 2014-07-25 LAB — BASIC METABOLIC PANEL
Anion gap: 7 (ref 5–15)
BUN: 16 mg/dL (ref 6–23)
CO2: 28 mmol/L (ref 19–32)
CREATININE: 0.93 mg/dL (ref 0.50–1.10)
Calcium: 9.5 mg/dL (ref 8.4–10.5)
Chloride: 105 mEq/L (ref 96–112)
GFR calc Af Amer: 89 mL/min — ABNORMAL LOW (ref >90)
GFR calc non Af Amer: 77 mL/min — ABNORMAL LOW (ref >90)
Glucose, Bld: 87 mg/dL (ref 70–99)
Potassium: 3.8 mmol/L (ref 3.5–5.1)
Sodium: 140 mmol/L (ref 135–145)

## 2014-07-25 LAB — TROPONIN I
Troponin I: <0.03 ng/mL (ref <0.031)
Troponin I: <0.03 ng/mL (ref <0.031)

## 2014-07-25 LAB — D-DIMER, QUANTITATIVE (NOT AT ARMC): D-Dimer, Quant: 0.28 ug/mL-FEU (ref 0.00–0.48)

## 2014-07-25 LAB — MAGNESIUM: MAGNESIUM: 1.8 mg/dL (ref 1.5–2.5)

## 2014-07-25 MED ORDER — TRAMADOL HCL 50 MG PO TABS: 50.0000 mg | ORAL_TABLET | Freq: Four times a day (QID) | ORAL | Status: DC | PRN

## 2014-07-25 MED ORDER — IBUPROFEN 800 MG PO TABS
800.0000 mg | ORAL_TABLET | Freq: Once | ORAL | Status: AC
  Administered 2014-07-25: 800 mg via ORAL
  Filled 2014-07-25: qty 1

## 2014-07-25 MED ORDER — IBUPROFEN 800 MG PO TABS: 800.0000 mg | ORAL_TABLET | Freq: Three times a day (TID) | ORAL | Status: DC | PRN

## 2014-07-25 MED ORDER — TRAMADOL HCL 50 MG PO TABS
50.0000 mg | ORAL_TABLET | Freq: Once | ORAL | Status: AC
  Administered 2014-07-25: 50 mg via ORAL
  Filled 2014-07-25: qty 1

## 2014-07-25 MED ORDER — ONDANSETRON 4 MG PO TBDP: 4.0000 mg | ORAL_TABLET | Freq: Three times a day (TID) | ORAL | Status: DC | PRN

## 2014-07-25 MED ORDER — SODIUM CHLORIDE 0.9 % IV BOLUS (SEPSIS)
1000.0000 mL | Freq: Once | INTRAVENOUS | Status: AC
  Administered 2014-07-25: 1000 mL via INTRAVENOUS

## 2014-07-25 NOTE — ED Notes (Signed)
Pt states she has been dizzy since last night. States she passed out while in the shower and hit her head on the tub. States pain to head, right wrist, and right hip

## 2014-07-25 NOTE — ED Provider Notes (Signed)
This chart was scribed for [place], DO by Edison Simon, ED Scribe. This patient was seen in room [facility]03/[facility]03   TIME SEEN: 2:37 PM  CHIEF COMPLAINT: syncope  HPI: Chloe Marks is a 39 y.o. female with history of hypertension, diabetes, prior stroke who presents to the Emergency Department complaining of syncope while in shower today. She states she felt lightheaded yesterday with associated SOB and chest pain; she states she has associated chest pain and nausea now but no SOB. She states nothing makes her chest pain worse or better. Describes the pain as sharp and central without radiation. She reports some pain to her upper back, right wrist, and right hip from falling. States she thinks she did hit her head but has no headache, neck or back pain. Not on anticoagulation. She states her last period was the second week of last night. She states her periods last 5-7 days and she sometimes passes clots. She states she has not never needed transfusions. She denies hsitory of blood clots in lungs or legs. She denies vomiting, diarrhea, abdominal pain, calf pain or swelling. No numbness, tingling or focal weakness.   ROS: See HPI Constitutional: no fever  Eyes: no drainage  ENT: no runny nose   Cardiovascular:  Positive chest pain Resp: positive SOB GI: no vomiting, positive nausea GU: no dysuria Integumentary: no rash  Allergy: no hives  Musculoskeletal: no leg swelling, positive pain to right hip, right wrist, upper back Neurological: no slurred speech, positive syncope, lightheadedness ROS otherwise negative  PAST MEDICAL HISTORY/PAST SURGICAL HISTORY:  Past Medical History  Diagnosis Date  . Hypertension   . 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 Diabetes mellitus without complication     MEDICATIONS:  Prior to Admission medications   Medication Sig Start Date End Date Taking? Authorizing Provider  amoxicillin (AMOXIL) 500 MG capsule Take 1 capsule (500 mg  total) by mouth 3 (three) times daily. 04/28/14   [place], NP  aspirin EC 81 MG tablet Take 81 mg by mouth daily.    Historical Provider, MD  cephALEXin (KEFLEX) 500 MG capsule Take 1 capsule (500 mg total) by mouth 3 (three) times daily. For 10 days 12/05/13   Tammy L. Triplett, PA-C  docusate sodium (COLACE) 100 MG capsule Take 100 mg by mouth daily.    Historical Provider, MD  griseofulvin (GRIFULVIN V) 500 MG tablet Take 1 tablet (500 mg total) by mouth daily. 12/05/13   Tammy L. Triplett, PA-C  iron polysaccharides (NIFEREX) 150 MG capsule Take 150 mg by mouth daily.    Historical Provider, MD  lisinopril-hydrochlorothiazide (PRINZIDE,ZESTORETIC) 10-12.5 MG per tablet Take 1 tablet by mouth daily.      Historical Provider, MD  naproxen sodium (ALEVE) 220 MG tablet Take 220-440 mg by mouth daily as needed (for pain).    Historical Provider, MD  oxyCODONE-acetaminophen (PERCOCET/ROXICET) 5-325 MG per tablet Take 1 tablet by mouth every 6 (six) hours as needed for severe pain. 12/05/13   Tammy L. Triplett, PA-C  sertraline (ZOLOFT) 100 MG tablet Take 100 mg by mouth every evening.    Historical Provider, MD  traMADol (ULTRAM) 50 MG tablet Take 1 tablet (50 mg total) by mouth every 6 (six) hours as needed. 04/28/14   Hope Bunnie Pion, NP    ALLERGIES:  Allergies  Allergen Reactions  . Vicodin [Hydrocodone-Acetaminophen] Nausea And Vomiting and Other (See Comments)    dizziness    SOCIAL HISTORY:  History  Substance Use Topics  . Smoking status: Current Every Day Smoker -- 0.25 packs/day for 4 years    Types: Cigarettes  . Smokeless tobacco: Not on file  . Alcohol Use: No    FAMILY HISTORY: No family history on file.  EXAM: BP 126/90 mmHg  Pulse 84  Temp(Src) 98.7 F (37.1 C) (Oral)  Resp 16  Ht 5\' 7"  (1.702 m)  Wt 205 lb (92.987 kg)  BMI 32.10 kg/m2  SpO2 100%  LMP 06/21/2014 CONSTITUTIONAL: Alert and oriented and responds appropriately to questions. Well-appearing;  well-nourished HEAD: Normocephalic EYES: Conjunctivae clear, PERRL ENT: normal nose; no rhinorrhea; moist mucous membranes; pharynx without lesions noted NECK: Supple, no meningismus, no LAD  CARD: RRR; S1 and S2 appreciated; no murmurs, no clicks, no rubs, no gallops RESP: Normal chest excursion without splinting or tachypnea; breath sounds clear and equal bilaterally; no wheezes, no rhonchi, no rales,  ABD/GI: Normal bowel sounds; non-distended; soft, non-tender, no rebound, no guarding BACK:  The back appears normal and is non-tender to palpation, there is no CVA tenderness EXT: Normal ROM in all joints; non-tender to palpation; no edema; normal capillary refill; no cyanosis; Tenderness to palpation over right hip, right hand, and right wrist diffusely without focal tenderness and without ecchymosis or swelling; Full ROM of all joints, no joint effusion, no obvious bony deformity SKIN: Normal color for age and race; warm NEURO: Moves all extremities equally; sensation to light touch intact diffusely, cranial nerves 2-12 intact PSYCH: The patient's mood and manner are appropriate. Grooming and personal hygiene are appropriate.  MEDICAL DECISION MAKING: Patient here with syncopal event. She is complaining of right wrist, right hand and right hip pain after a fall in the shower today. She did have chest pain and shortness of breath and felt lightheaded last night and then had chest pain again today. No risk factors for pulmonary embolus or DVT. Does have several risk factors for ACS. EKG is nonischemic without interval changes, no LVH, no prolonged QTC, no Brugada, no delta wave. We'll obtain cardiac labs, d-dimer, urine pregnancy test, chest x-ray, x-ray of her right hand and right wrist and right hip. She declines pain medication at this time. She is not orthostatic.  ED PROGRESS: Patient's labs are unremarkable other than mild anemia with hemoglobin of 9.7. Urine pregnancy test has been confirmed  as negative. Troponin negative x 2. D-dimer negative. Potassium 3.8, magnesium 1.8.  Given her very atypical chest pain and do not feel she needs to be admitted at this time and she is comfortable with this plan for outpatient follow-up. She is chest pain-free. She does appear to have a fifth metacarpal fracture that is nondisplaced at the base. Will place and volar splint and give her hand surgery follow-up information. Otherwise x-rays are unremarkable. Discussed return precautions and importance of increasing fluid intake. Patient verbalizes understanding and is comfortable with plan.     EKG Interpretation  Date/Time:  Wednesday July 25 2014 14:17:07 EST Ventricular Rate:  67 PR Interval:  192 QRS Duration: 95 QT Interval:  381 QTC Calculation: 402 R Axis:   42 Text Interpretation:  Sinus rhythm Confirmed by WARD,  DO, KRISTEN [phone]) on 07/25/2014 2:47:12 PM        SPLINT APPLICATION Date/Time: 3:55 PM Authorized by: Nyra Jabs Consent: Verbal consent obtained. Risks and benefits: risks, benefits and alternatives were discussed Consent given by: patient Splint applied by: nursing technician Location details: right hand Splint type: Volar splint  Supplies  used: Fiberglass Post-procedure: The splinted body part was neurovascularly unchanged following the procedure. Patient tolerance: Patient tolerated the procedure well with no immediate complications.       I personally performed the services described in this documentation, which was scribed in my presence. The recorded information has been reviewed and is accurate.    [place], DO 07/25/14 1933

## 2014-07-25 NOTE — ED Notes (Signed)
Dr. Ward at bedside.

## 2014-07-25 NOTE — ED Notes (Signed)
Patient resting in bed at this time, family at bedside. Sprite given at this time, no needs voiced.

## 2014-07-25 NOTE — Discharge Instructions (Signed)
Chest Pain (Nonspecific) °It is often hard to give a specific diagnosis for the cause of chest pain. There is always a chance that your pain could be related to something serious, such as a heart attack or a blood clot in the lungs. You need to follow up with your health care provider for further evaluation. °CAUSES  °· Heartburn. °· Pneumonia or bronchitis. °· Anxiety or stress. °· Inflammation around your heart (pericarditis) or lung (pleuritis or pleurisy). °· A blood clot in the lung. °· A collapsed lung (pneumothorax). It can develop suddenly on its own (spontaneous pneumothorax) or from trauma to the chest. °· Shingles infection (herpes zoster virus). °The chest wall is composed of bones, muscles, and cartilage. Any of these can be the source of the pain. °· The bones can be bruised by injury. °· The muscles or cartilage can be strained by coughing or overwork. °· The cartilage can be affected by inflammation and become sore (costochondritis). °DIAGNOSIS  °Lab tests or other studies may be needed to find the cause of your pain. Your health care provider may have you take a test called an ambulatory electrocardiogram (ECG). An ECG records your heartbeat patterns over a 24-hour period. You may also have other tests, such as: °· Transthoracic echocardiogram (TTE). During echocardiography, sound waves are used to evaluate how blood flows through your heart. °· Transesophageal echocardiogram (TEE). °· Cardiac monitoring. This allows your health care provider to monitor your heart rate and rhythm in real time. °· Holter monitor. This is a portable device that records your heartbeat and can help diagnose heart arrhythmias. It allows your health care provider to track your heart activity for several days, if needed. °· Stress tests by exercise or by giving medicine that makes the heart beat faster. °TREATMENT  °· Treatment depends on what may be causing your chest pain. Treatment may include: °· Acid blockers for  heartburn. °· Anti-inflammatory medicine. °· Pain medicine for inflammatory conditions. °· Antibiotics if an infection is present. °· You may be advised to change lifestyle habits. This includes stopping smoking and avoiding alcohol, caffeine, and chocolate. °· You may be advised to keep your head raised (elevated) when sleeping. This reduces the chance of acid going backward from your stomach into your esophagus. °Most of the time, nonspecific chest pain will improve within 2-3 days with rest and mild pain medicine.  °HOME CARE INSTRUCTIONS  °· If antibiotics were prescribed, take them as directed. Finish them even if you start to feel better. °· For the next few days, avoid physical activities that bring on chest pain. Continue physical activities as directed. °· Do not use any tobacco products, including cigarettes, chewing tobacco, or electronic cigarettes. °· Avoid drinking alcohol. °· Only take medicine as directed by your health care provider. °· Follow your health care provider's suggestions for further testing if your chest pain does not go away. °· Keep any follow-up appointments you made. If you do not go to an appointment, you could develop lasting (chronic) problems with pain. If there is any problem keeping an appointment, call to reschedule. °SEEK MEDICAL CARE IF:  °· Your chest pain does not go away, even after treatment. °· You have a rash with blisters on your chest. °· You have a fever. °SEEK IMMEDIATE MEDICAL CARE IF:  °· You have increased chest pain or pain that spreads to your arm, neck, jaw, back, or abdomen. °· You have shortness of breath. °· You have an increasing cough, or you cough   up blood.  You have severe back or abdominal pain.  You feel nauseous or vomit.  You have severe weakness.  You faint.  You have chills. This is an emergency. Do not wait to see if the pain will go away. Get medical help at once. Call your local emergency services (911 in [place] Do not drive  yourself to the hospital. MAKE SURE YOU:   Understand these instructions.  Will watch your condition.  Will get help right away if you are not doing well or get worse. Document Released: 04/08/2005 Document Revised: 07/04/2013 Document Reviewed: 02/02/2008 [employer] Patient Information 2015 [place], [place]. This information is not intended to replace advice given to you by your health care provider. Make sure you discuss any questions you have with your health care provider.  Syncope Syncope is a medical term for fainting or passing out. This means you lose consciousness and drop to the ground. People are generally unconscious for less than 5 minutes. You may have some muscle twitches for up to 15 seconds before waking up and returning to normal. Syncope occurs more often in older adults, but it can happen to anyone. While most causes of syncope are not dangerous, syncope can be a sign of a serious medical problem. It is important to seek medical care.  CAUSES  Syncope is caused by a sudden drop in blood flow to the brain. The specific cause is often not determined. Factors that can bring on syncope include:  Taking medicines that lower blood pressure.  Sudden changes in posture, such as standing up quickly.  Taking more medicine than prescribed.  Standing in one place for too long.  Seizure disorders.  Dehydration and excessive exposure to heat.  Low blood sugar (hypoglycemia).  Straining to have a bowel movement.  Heart disease, irregular heartbeat, or other circulatory problems.  Fear, emotional distress, seeing blood, or severe pain. SYMPTOMS  Right before fainting, you may:  Feel dizzy or light-headed.  Feel nauseous.  See all white or all black in your field of vision.  Have cold, clammy skin. DIAGNOSIS  Your health care provider will ask about your symptoms, perform a physical exam, and perform an electrocardiogram (ECG) to record the electrical activity of your  heart. Your health care provider may also perform other heart or blood tests to determine the cause of your syncope which may include:  Transthoracic echocardiogram (TTE). During echocardiography, sound waves are used to evaluate how blood flows through your heart.  Transesophageal echocardiogram (TEE).  Cardiac monitoring. This allows your health care provider to monitor your heart rate and rhythm in real time.  Holter monitor. This is a portable device that records your heartbeat and can help diagnose heart arrhythmias. It allows your health care provider to track your heart activity for several days, if needed.  Stress tests by exercise or by giving medicine that makes the heart beat faster. TREATMENT  In most cases, no treatment is needed. Depending on the cause of your syncope, your health care provider may recommend changing or stopping some of your medicines. HOME CARE INSTRUCTIONS  Have someone stay with you until you feel stable.  Do not drive, use machinery, or play sports until your health care provider says it is okay.  Keep all follow-up appointments as directed by your health care provider.  Lie down right away if you start feeling like you might faint. Breathe deeply and steadily. Wait until all the symptoms have passed.  Drink enough fluids to keep your  urine clear or pale yellow.  If you are taking blood pressure or heart medicine, get up slowly and take several minutes to sit and then stand. This can reduce dizziness. SEEK IMMEDIATE MEDICAL CARE IF:   You have a severe headache.  You have unusual pain in the chest, abdomen, or back.  You are bleeding from your mouth or rectum, or you have black or tarry stool.  You have an irregular or very fast heartbeat.  You have pain with breathing.  You have repeated fainting or seizure-like jerking during an episode.  You faint when sitting or lying down.  You have confusion.  You have trouble walking.  You have  severe weakness.  You have vision problems. If you fainted, call your local emergency services (911 in [place] Do not drive yourself to the hospital.  MAKE SURE YOU:  Understand these instructions.  Will watch your condition.  Will get help right away if you are not doing well or get worse. Document Released: 06/29/2005 Document Revised: 07/04/2013 Document Reviewed: 08/28/2011 [facility] Patient Information 2015 [place], [place]. This information is not intended to replace advice given to you by your health care provider. Make sure you discuss any questions you have with your health care provider.    Hand Fracture, Fifth Metacarpal The small metacarpal is the bone at the base of the little finger between the knuckle and the wrist. A fracture is a break in that bone. One of the fractures that is common to this bone is called a Boxer's Fracture. TREATMENT These fractures can be treated with:   Reduction (bones moved back into place), then pinned through the skin to maintain the position, and then casted for about 6 weeks or as your caregiver determines necessary.  ORIF (open reduction and internal fixation) - the fracture site is opened and the bone pieces are fixed into place with pins and then casted for approximately 6 weeks or as your caregiver determines necessary. Your caregiver will discuss the type of fracture you have and the treatment that should be best for that problem. If surgery is the treatment of choice, the following is information for you to know, and also let your caregiver know about prior to surgery.  LET YOUR CAREGIVER KNOW ABOUT:  Allergies.  Medications taken including herbs, eye drops, over the counter medications, and creams.  Use of steroids (by mouth or creams).  Previous problems with anesthetics or novocaine.  Possibility of pregnancy, if this applies.  History of blood clots (thrombophlebitis).  History of bleeding or blood problems.  Previous  surgery.  Other health problems. AFTER THE PROCEDURE After surgery, you will be taken to the recovery area where a nurse will watch and check your progress. Once you're awake, stable, and taking fluids well, barring other problems you'll be allowed to go home. Once home an ice pack applied to your operative site may help with discomfort and keep the swelling down. HOME CARE INSTRUCTIONS   Follow your caregiver's instructions as to activities, exercises, physical therapy, and driving a car.  Daily exercise is helpful for maintaining range of motion (movement and mobility) and strength. Exercise as instructed.  To lessen swelling, keep the injured hand elevated above the level of your heart as much as possible.  Apply ice to the injury for 15-20 minutes each hour while awake for the first 2 days. Put the ice in a plastic bag and place a thin towel between the bag of ice and your cast.  Move the  fingers of your casted hand at least several times a day.  If a plaster or fiberglass cast was applied:  Do not try to scratch the skin under the cast using a sharp or pointed object.  Check the skin around the cast every day. You may put lotion on red or sore areas.  Keep your cast dry. Your cast can be protected during bathing with a plastic bag. Do not put your cast into the water.  If a plaster splint was applied:  Wear the splint for as long as directed by your caregiver or until seen for follow-up examination.  Do not get your splint wet. Protect it during bathing with a plastic bag.  You may loosen the elastic bandage around the splint if your fingers start to get numb, tingle, get cold or turn blue.  Do not put pressure on your cast or splint; this may cause it to break. Especially, do not lean plaster casts on hard surfaces for 24 hours after application.  Take medications as directed by your caregiver.  Only take over-the-counter or prescription medicines for pain, discomfort, or  fever as directed by your caregiver.  Follow all instructions for physician referrals, physical therapy, and rehabilitation. Any delay in obtaining necessary care could result in permanent injury, disability and chronic pain. SEEK MEDICAL CARE IF:   Increased bleeding (more than a small spot) from the wound or from beneath your cast or splint if there is a wound beneath the cast from surgery.  Redness, swelling, or increasing pain in the wound or from beneath your cast or splint.  Pus coming from wound or from beneath your cast or splint.  An unexplained oral temperature above 102 F (38.9 C) develops.  A foul smell coming from the wound or dressing or from beneath your cast or splint.  You are unable to move your little finger. SEEK IMMEDIATE MEDICAL CARE IF:  You develop a rash, have difficulty breathing, or have any allergy problems. If you do not have a window in your cast for observing the wound, a discharge or minor bleeding may show up as a stain on the outside of your cast. Report these findings to your caregiver. MAKE SURE YOU:   Understand these instructions.  Will watch your condition.  Will get help right away if you are not doing well or get worse. Document Released: 10/05/2000 Document Revised: 09/21/2011 Document Reviewed: 02/16/2008 [facility] Patient Information 2015 [place], [place]. This information is not intended to replace advice given to you by your health care provider. Make sure you discuss any questions you have with your health care provider.    RICE: Routine Care for Injuries The routine care of many injuries includes Rest, Ice, Compression, and Elevation (RICE). HOME CARE INSTRUCTIONS  Rest is needed to allow your body to heal. Routine activities can usually be resumed when comfortable. Injured tendons and bones can take up to 6 weeks to heal. Tendons are the cord-like structures that attach muscle to bone.  Ice following an injury helps keep the  swelling down and reduces pain.  Put ice in a plastic bag.  Place a towel between your skin and the bag.  Leave the ice on for 15-20 minutes, 3-4 times a day, or as directed by your health care provider. Do this while awake, for the first 24 to 48 hours. After that, continue as directed by your caregiver.  Compression helps keep swelling down. It also gives support and helps with discomfort. If an elastic  bandage has been applied, it should be removed and reapplied every 3 to 4 hours. It should not be applied tightly, but firmly enough to keep swelling down. Watch fingers or toes for swelling, bluish discoloration, coldness, numbness, or excessive pain. If any of these problems occur, remove the bandage and reapply loosely. Contact your caregiver if these problems continue.  Elevation helps reduce swelling and decreases pain. With extremities, such as the arms, hands, legs, and feet, the injured area should be placed near or above the level of the heart, if possible. SEEK IMMEDIATE MEDICAL CARE IF:  You have persistent pain and swelling.  You develop redness, numbness, or unexpected weakness.  Your symptoms are getting worse rather than improving after several days. These symptoms may indicate that further evaluation or further X-rays are needed. Sometimes, X-rays may not show a small broken bone (fracture) until 1 week or 10 days later. Make a follow-up appointment with your caregiver. Ask when your X-ray results will be ready. Make sure you get your X-ray results. Document Released: 10/11/2000 Document Revised: 07/04/2013 Document Reviewed: 11/28/2010 [facility] Patient Information 2015 [place], [place]. This information is not intended to replace advice given to you by your health care provider. Make sure you discuss any questions you have with your health care provider.

## 2014-07-27 LAB — CBG MONITORING, ED: GLUCOSE-CAPILLARY: 96 mg/dL (ref 70–99)

## 2014-07-27 LAB — POC URINE PREG, ED: Preg Test, Ur: NEGATIVE

## 2014-09-27 ENCOUNTER — Other Ambulatory Visit (HOSPITAL_COMMUNITY): Payer: Self-pay | Admitting: Family Medicine

## 2014-09-27 DIAGNOSIS — N939 Abnormal uterine and vaginal bleeding, unspecified: Secondary | ICD-10-CM

## 2014-10-01 ENCOUNTER — Other Ambulatory Visit (HOSPITAL_COMMUNITY): Payer: Self-pay | Admitting: Family Medicine

## 2014-10-01 DIAGNOSIS — N939 Abnormal uterine and vaginal bleeding, unspecified: Secondary | ICD-10-CM

## 2014-10-03 ENCOUNTER — Ambulatory Visit (HOSPITAL_COMMUNITY)
Admission: RE | Admit: 2014-10-03 | Discharge: 2014-10-03 | Disposition: A | Discharge status: Home / Self Care | Payer: 59 | Source: Ambulatory Visit | Attending: Family Medicine | Admitting: Family Medicine

## 2014-10-03 DIAGNOSIS — N939 Abnormal uterine and vaginal bleeding, unspecified: Secondary | ICD-10-CM

## 2014-10-03 DIAGNOSIS — N920 Excessive and frequent menstruation with regular cycle: Secondary | ICD-10-CM | POA: Insufficient documentation

## 2014-10-03 DIAGNOSIS — N8329 Other ovarian cysts: Secondary | ICD-10-CM | POA: Insufficient documentation

## 2015-07-01 ENCOUNTER — Emergency Department (HOSPITAL_COMMUNITY)
Admission: EM | Admit: 2015-07-01 | Discharge: 2015-07-01 | Disposition: A | Discharge status: Home / Self Care | Payer: Medicaid Other | Attending: Emergency Medicine | Admitting: Emergency Medicine

## 2015-07-01 ENCOUNTER — Encounter (HOSPITAL_COMMUNITY): Payer: Self-pay | Admitting: Emergency Medicine

## 2015-07-01 DIAGNOSIS — J019 Acute sinusitis, unspecified: Secondary | ICD-10-CM | POA: Diagnosis not present

## 2015-07-01 DIAGNOSIS — F17210 Nicotine dependence, cigarettes, uncomplicated: Secondary | ICD-10-CM | POA: Insufficient documentation

## 2015-07-01 DIAGNOSIS — Z8673 Personal history of transient ischemic attack (TIA), and cerebral infarction without residual deficits: Secondary | ICD-10-CM | POA: Insufficient documentation

## 2015-07-01 DIAGNOSIS — Z7982 Long term (current) use of aspirin: Secondary | ICD-10-CM | POA: Diagnosis not present

## 2015-07-01 DIAGNOSIS — Z7984 Long term (current) use of oral hypoglycemic drugs: Secondary | ICD-10-CM | POA: Diagnosis not present

## 2015-07-01 DIAGNOSIS — I10 Essential (primary) hypertension: Secondary | ICD-10-CM | POA: Insufficient documentation

## 2015-07-01 DIAGNOSIS — E119 Type 2 diabetes mellitus without complications: Secondary | ICD-10-CM | POA: Diagnosis not present

## 2015-07-01 DIAGNOSIS — R42 Dizziness and giddiness: Secondary | ICD-10-CM | POA: Diagnosis not present

## 2015-07-01 DIAGNOSIS — M25511 Pain in right shoulder: Secondary | ICD-10-CM | POA: Diagnosis not present

## 2015-07-01 DIAGNOSIS — Z79899 Other long term (current) drug therapy: Secondary | ICD-10-CM | POA: Insufficient documentation

## 2015-07-01 LAB — BASIC METABOLIC PANEL WITH GFR
Anion gap: 7 (ref 5–15)
BUN: 16 mg/dL (ref 6–20)
CO2: 28 mmol/L (ref 22–32)
Calcium: 9.2 mg/dL (ref 8.9–10.3)
Chloride: 103 mmol/L (ref 101–111)
Creatinine, Ser: 0.96 mg/dL (ref 0.44–1.00)
GFR calc Af Amer: >60 mL/min
GFR calc non Af Amer: >60 mL/min
Glucose, Bld: 91 mg/dL (ref 65–99)
Potassium: 3.2 mmol/L — ABNORMAL LOW (ref 3.5–5.1)
Sodium: 138 mmol/L (ref 135–145)

## 2015-07-01 LAB — CBC
HCT: 33.2 % — ABNORMAL LOW (ref 36.0–46.0)
Hemoglobin: 10.4 g/dL — ABNORMAL LOW (ref 12.0–15.0)
MCH: 22.1 pg — AB (ref 26.0–34.0)
MCHC: 31.3 g/dL (ref 30.0–36.0)
MCV: 70.6 fL — ABNORMAL LOW (ref 78.0–100.0)
PLATELETS: 311 10*3/uL (ref 150–400)
RBC: 4.7 MIL/uL (ref 3.87–5.11)
RDW: 17.3 % — ABNORMAL HIGH (ref 11.5–15.5)
WBC: 7.7 10*3/uL (ref 4.0–10.5)

## 2015-07-01 LAB — URINALYSIS, ROUTINE W REFLEX MICROSCOPIC
Bilirubin Urine: NEGATIVE
Glucose, UA: NEGATIVE mg/dL
Hgb urine dipstick: NEGATIVE
Leukocytes, UA: NEGATIVE
Nitrite: NEGATIVE
Protein, ur: NEGATIVE mg/dL
Specific Gravity, Urine: >1.03 — ABNORMAL HIGH (ref 1.005–1.030)
pH: 6 (ref 5.0–8.0)

## 2015-07-01 LAB — CBG MONITORING, ED: Glucose-Capillary: 86 mg/dL (ref 65–99)

## 2015-07-01 MED ORDER — AZITHROMYCIN 250 MG PO TABS
500.0000 mg | ORAL_TABLET | Freq: Once | ORAL | Status: AC
  Administered 2015-07-01: 500 mg via ORAL
  Filled 2015-07-01: qty 2

## 2015-07-01 MED ORDER — IBUPROFEN 800 MG PO TABS
800.0000 mg | ORAL_TABLET | Freq: Once | ORAL | Status: AC
  Administered 2015-07-01: 800 mg via ORAL
  Filled 2015-07-01: qty 1

## 2015-07-01 MED ORDER — AZITHROMYCIN 250 MG PO TABS: 250.0000 mg | ORAL_TABLET | Freq: Every day | ORAL | Status: DC

## 2015-07-01 MED ORDER — SODIUM CHLORIDE 0.9 % IV BOLUS (SEPSIS)
1000.0000 mL | Freq: Once | INTRAVENOUS | Status: AC
  Administered 2015-07-01: 1000 mL via INTRAVENOUS

## 2015-07-01 NOTE — ED Notes (Signed)
PT states dizziness intermittent with right sided lower neck pain, sore throat, sinus congestion x2 days.

## 2015-07-01 NOTE — ED Provider Notes (Signed)
CSN: [id number]     Arrival date & time 07/01/15  1132 History  By signing my name below, I, Chloe Marks, attest that this documentation has been prepared under the direction and in the presence of [name], MD. Electronically Signed: Terressa Marks, ED Scribe. 07/01/2015. 12:01 PM.  Chief Complaint  Patient presents with  . Dizziness   The history is provided by the patient. No language interpreter was used.   PCP: No PCP Per Patient HPI Comments: Chloe Marks is a 39 y.o. female, with PMHx noted below including HTN, DM, stroke, who presents to the Emergency Department complaining of intermittent dizziness (room spinning sensation when going to standing position from a sitting position) with associated nausea; swelling and pain of bilateral shoulders; body aches; sore throat; and sinus congestion onset two days ago. Pt reports her Sx began with generalized body aches followed by sore throat, then onset of dizziness and congestion. Pt denies eating anything today reporting last time she ate was 6pm yesterday. 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   Past Medical History  Diagnosis Date  . Hypertension   . Stroke [facility]) august 2011    after gall bladder surgery  . Diabetes mellitus without complication [facility])    Past Surgical History  Procedure Laterality Date  . Cholecystectomy    . Tubal ligation     History reviewed. No pertinent family history. Social History  Substance Use Topics  . Smoking status: Current Every Day Smoker -- 0.25 packs/day for 4 years    Types: Cigarettes, Cigars  . Smokeless tobacco: None  . Alcohol Use: No   OB History    No data available     Review of Systems  Constitutional: Negative for fever.  HENT: Positive for congestion and sore throat.   Musculoskeletal: Positive for arthralgias (shoulder pain).  Neurological: Positive for dizziness.  All other systems reviewed and are negative.  Allergies   Vicodin  Home Medications   Prior to Admission medications   Medication Sig Start Date End Date Taking? Authorizing Provider  amoxicillin (AMOXIL) 500 MG capsule Take 1 capsule (500 mg total) by mouth 3 (three) times daily. Patient not taking: Reported on 07/25/2014 04/28/14   Ashley Murrain, NP  aspirin EC 81 MG tablet Take 81 mg by mouth daily.    Historical Provider, MD  cephALEXin (KEFLEX) 500 MG capsule Take 1 capsule (500 mg total) by mouth 3 (three) times daily. For 10 days Patient not taking: Reported on 07/25/2014 12/05/13   Tammy Triplett, PA-C  griseofulvin (GRIFULVIN V) 500 MG tablet Take 1 tablet (500 mg total) by mouth daily. Patient not taking: Reported on 07/25/2014 12/05/13   Tammy Triplett, PA-C  ibuprofen (ADVIL,MOTRIN) 800 MG tablet Take 1 tablet (800 mg total) by mouth every 8 (eight) hours as needed for mild pain. 07/25/14   Kristen N Ward, DO  iron polysaccharides (NIFEREX) 150 MG capsule Take 150 mg by mouth daily.    Historical Provider, MD  lisinopril-hydrochlorothiazide (PRINZIDE,ZESTORETIC) 10-12.5 MG per tablet Take 1 tablet by mouth daily.      Historical Provider, MD  metFORMIN (GLUCOPHAGE) 500 MG tablet Take 500 mg by mouth 2 (two) times daily with a meal.    Historical Provider, MD  naproxen sodium (ALEVE) 220 MG tablet Take 220-440 mg by mouth daily as needed (for pain).    Historical Provider, MD  ondansetron (ZOFRAN ODT) 4 MG disintegrating tablet Take 1 tablet (4 mg total) by  mouth every 8 (eight) hours as needed for nausea or vomiting. 07/25/14   Kristen N Ward, DO  oxyCODONE-acetaminophen (PERCOCET/ROXICET) 5-325 MG per tablet Take 1 tablet by mouth every 6 (six) hours as needed for severe pain. Patient not taking: Reported on 07/25/2014 12/05/13   Tammy Triplett, PA-C  traMADol (ULTRAM) 50 MG tablet Take 1 tablet (50 mg total) by mouth every 6 (six) hours as needed. 07/25/14   Kristen N Ward, DO   Triage Vitals: BP 129/85 mmHg  Pulse 86  Temp(Src) 98.1 F (36.7  C) (Oral)  Resp 16  Ht 5\' 7"  (1.702 m)  Wt 205 lb (92.987 kg)  BMI 32.10 kg/m2  SpO2 99%  LMP 06/17/2015 Physical Exam  Constitutional: She appears well-developed and well-nourished. No distress.  Very well appearing, no distress, speaks in complete sentences.   HENT:  Head: Normocephalic and atraumatic.  Mouth/Throat: Oropharynx is clear and moist. No oropharyngeal exudate.  Eyes: Conjunctivae and EOM are normal. Pupils are equal, round, and reactive to light. Right eye exhibits no discharge. Left eye exhibits no discharge. No scleral icterus.  Neck: Normal range of motion. Neck supple. No JVD present. No thyromegaly present.  Cardiovascular: Normal rate, regular rhythm, normal heart sounds and intact distal pulses.  Exam reveals no gallop and no friction rub.   No murmur heard. Pulmonary/Chest: Effort normal and breath sounds normal. No respiratory distress. She has no wheezes. She has no rales.  Abdominal: Soft. Bowel sounds are normal. She exhibits no distension and no mass. There is no tenderness.  Musculoskeletal: Normal range of motion. She exhibits tenderness. She exhibits no edema.  Tenderness on right trapezius at C7, T1.   Lymphadenopathy:    She has no cervical adenopathy.  Neurological: She is alert. Coordination normal.  Neurologic exam:  Speech clear, pupils equal round reactive to light, extraocular movements intact  Normal peripheral visual fields Cranial nerves III through XII normal including no facial droop Follows commands, moves all extremities x4, normal strength to bilateral upper and lower extremities at all major muscle groups including grip Sensation normal to light touch and pinprick Coordination intact, no limb ataxia, finger-nose-finger normal Rapid alternating movements normal No pronator drift Gait normal   Skin: Skin is warm and dry. No rash noted. No erythema.  Psychiatric: She has a normal mood and affect. Her behavior is normal.  Nursing note  and vitals reviewed.   ED Course  Procedures (including critical care time) DIAGNOSTIC STUDIES: Oxygen Saturation is 99% on ra, nl by my interpretation.    COORDINATION OF CARE: 11:59 AM: Discussed treatment plan which includes IV fluids and antibiotics with pt at bedside; patient verbalizes understanding and agrees with treatment plan.  Labs Review Labs Reviewed  BASIC METABOLIC PANEL  CBC  URINALYSIS, ROUTINE W REFLEX MICROSCOPIC (NOT AT [facility])  CBG MONITORING, ED   I have personally reviewed and evaluated these lab results as part of my medical decision-making.    MDM   Final diagnoses:  None   The pt has no acute findings on exam to suggest need for further w/u - has some muscle ttp but no acute neuro defecits - no LAD and no signs of airway compromise - possible early sinus vs URI, VS normal, normal neuro  I personally performed the services described in this documentation, which was scribed in my presence. The recorded information has been reviewed and is accurate.       [name], MD 07/01/15 [phone]

## 2015-07-01 NOTE — Discharge Instructions (Signed)

## 2016-01-29 ENCOUNTER — Other Ambulatory Visit: Payer: Self-pay | Admitting: Physician Assistant

## 2016-01-29 DIAGNOSIS — Z1231 Encounter for screening mammogram for malignant neoplasm of breast: Secondary | ICD-10-CM

## 2016-02-11 ENCOUNTER — Ambulatory Visit: Payer: 59

## 2016-02-25 ENCOUNTER — Ambulatory Visit: Payer: 59

## 2017-02-26 ENCOUNTER — Encounter (HOSPITAL_COMMUNITY): Payer: Self-pay | Admitting: *Deleted

## 2017-02-26 ENCOUNTER — Emergency Department (HOSPITAL_COMMUNITY)
Admission: EM | Admit: 2017-02-26 | Discharge: 2017-02-26 | Disposition: A | Discharge status: Home / Self Care | Payer: Commercial Managed Care - PPO | Attending: Emergency Medicine | Admitting: Emergency Medicine

## 2017-02-26 DIAGNOSIS — N939 Abnormal uterine and vaginal bleeding, unspecified: Secondary | ICD-10-CM | POA: Insufficient documentation

## 2017-02-26 DIAGNOSIS — I10 Essential (primary) hypertension: Secondary | ICD-10-CM | POA: Diagnosis not present

## 2017-02-26 DIAGNOSIS — D649 Anemia, unspecified: Secondary | ICD-10-CM | POA: Insufficient documentation

## 2017-02-26 DIAGNOSIS — R42 Dizziness and giddiness: Secondary | ICD-10-CM | POA: Diagnosis not present

## 2017-02-26 DIAGNOSIS — N938 Other specified abnormal uterine and vaginal bleeding: Secondary | ICD-10-CM | POA: Diagnosis present

## 2017-02-26 DIAGNOSIS — E119 Type 2 diabetes mellitus without complications: Secondary | ICD-10-CM | POA: Diagnosis not present

## 2017-02-26 DIAGNOSIS — Z87891 Personal history of nicotine dependence: Secondary | ICD-10-CM | POA: Diagnosis not present

## 2017-02-26 DIAGNOSIS — Z79899 Other long term (current) drug therapy: Secondary | ICD-10-CM | POA: Insufficient documentation

## 2017-02-26 LAB — CBC WITH DIFFERENTIAL/PLATELET
Basophils Absolute: 0 10*3/uL (ref 0.0–0.1)
Basophils Relative: 0 %
Eosinophils Absolute: 0 10*3/uL (ref 0.0–0.7)
Eosinophils Relative: 0 %
HCT: 29.6 % — ABNORMAL LOW (ref 36.0–46.0)
HEMOGLOBIN: 9.7 g/dL — AB (ref 12.0–15.0)
Lymphocytes Relative: 49 %
Lymphs Abs: 2.6 10*3/uL (ref 0.7–4.0)
MCH: 23.2 pg — ABNORMAL LOW (ref 26.0–34.0)
MCHC: 32.8 g/dL (ref 30.0–36.0)
MCV: 70.8 fL — ABNORMAL LOW (ref 78.0–100.0)
MONO ABS: 0.2 10*3/uL (ref 0.1–1.0)
Monocytes Relative: 5 %
NEUTROS PCT: 46 %
Neutro Abs: 2.4 10*3/uL (ref 1.7–7.7)
Platelets: 298 10*3/uL (ref 150–400)
RBC: 4.18 MIL/uL (ref 3.87–5.11)
RDW: 16.2 % — ABNORMAL HIGH (ref 11.5–15.5)
WBC: 5.3 10*3/uL (ref 4.0–10.5)

## 2017-02-26 LAB — BASIC METABOLIC PANEL
Anion gap: 8 (ref 5–15)
BUN: 14 mg/dL (ref 6–20)
CHLORIDE: 101 mmol/L (ref 101–111)
CO2: 29 mmol/L (ref 22–32)
CREATININE: 0.98 mg/dL (ref 0.44–1.00)
Calcium: 9.1 mg/dL (ref 8.9–10.3)
GFR calc Af Amer: >60 mL/min (ref >60)
GFR calc non Af Amer: >60 mL/min (ref >60)
Glucose, Bld: 102 mg/dL — ABNORMAL HIGH (ref 65–99)
Potassium: 3.3 mmol/L — ABNORMAL LOW (ref 3.5–5.1)
Sodium: 138 mmol/L (ref 135–145)

## 2017-02-26 LAB — HCG, SERUM, QUALITATIVE: PREG SERUM: NEGATIVE

## 2017-02-26 LAB — TYPE AND SCREEN
ABO/RH(D): O POS
Antibody Screen: NEGATIVE

## 2017-02-26 MED ORDER — MEGESTROL ACETATE 40 MG PO TABS: 40.0000 mg | ORAL_TABLET | Freq: Every day | ORAL | 0 refills | Status: DC

## 2017-02-26 NOTE — Discharge Instructions (Addendum)
Take medications as directed. Call [facility] for follow-up on Monday. Return for any new or worse symptoms. Return for worse vaginal bleeding.

## 2017-02-26 NOTE — ED Provider Notes (Signed)
[facility] DEPT Provider Note   CSN: [id number] Arrival date & time: 02/26/17  1018     History   Chief Complaint Chief Complaint  Patient presents with  . Vaginal Bleeding    HPI Chloe Marks is a 41 y.o. female.  Patient presents with heavy vaginal bleeding for 8 days. Bleeding started at the normal onset of her menses time. Does not usually have heavy bleeding like this. No significant pain associated with it. Patient known to have a history of fibroids. Patient is felt lightheaded. Patient said tubal ligation.      Past Medical History:  Diagnosis Date  . Diabetes mellitus without complication ([place])   . Hypertension   . Stroke [facility]) august 2011   after gall bladder surgery    Patient Active Problem List   Diagnosis Date Noted  . Closed head injury without loss of consciousness 06/11/2011    Past Surgical History:  Procedure Laterality Date  . CHOLECYSTECTOMY    . TUBAL LIGATION      OB History    No data available       Home Medications    Prior to Admission medications   Medication Sig Start Date End Date Taking? Authorizing Provider  aspirin EC 81 MG tablet Take 81 mg by mouth daily.   Yes [provider]  docusate sodium (COLACE) 100 MG capsule Take 100 mg by mouth daily.   Yes [provider]  ibuprofen (ADVIL,MOTRIN) 800 MG tablet Take 800 mg by mouth every 8 (eight) hours as needed.   Yes [provider]  iron polysaccharides (NIFEREX) 150 MG capsule Take 150 mg by mouth daily.   Yes [provider]  lisinopril-hydrochlorothiazide (PRINZIDE,ZESTORETIC) 10-12.5 MG per tablet Take 1 tablet by mouth daily.     Yes [provider]  megestrol (MEGACE) 40 MG tablet Take 1 tablet (40 mg total) by mouth daily. Take 1 tablet 3 times a day for the first 5 days. Then 2 tablets twice a day for the next 5 days. Then 1 tablet daily 02/26/17   Fredia Sorrow, MD    Family History No family history on  file.  Social History Social History  Substance Use Topics  . Smoking status: Former Smoker    Packs/day: 0.25    Years: 4.00    Types: Cigarettes, Cigars  . Smokeless tobacco: Never Used  . Alcohol use No     Allergies   Vicodin [hydrocodone-acetaminophen]   Review of Systems Review of Systems  Constitutional: Negative for fever.  HENT: Negative for congestion.   Eyes: Negative for redness.  Respiratory: Negative for shortness of breath.   Gastrointestinal: Negative for abdominal pain, nausea and vomiting.  Genitourinary: Positive for menstrual problem and vaginal bleeding. Negative for dysuria.  Musculoskeletal: Negative for back pain.  Skin: Negative for rash.  Neurological: Positive for light-headedness. Negative for syncope.  Hematological: Does not bruise/bleed easily.  Psychiatric/Behavioral: Negative for confusion.     Physical Exam Updated Vital Signs BP 118/89   Pulse 89   Temp 98.3 F (36.8 C)   Resp 20   Ht 1.702 m (5\' 7" )   Wt 92.1 kg (203 lb)   SpO2 100%   BMI 31.79 kg/m   Physical Exam  Constitutional: She is oriented to person, place, and time. She appears well-developed and well-nourished. No distress.  HENT:  Head: Normocephalic and atraumatic.  Mouth/Throat: Oropharynx is clear and moist.  Eyes: Pupils are equal, round, and reactive to light. EOM are  normal.  Neck: Normal range of motion. Neck supple.  Cardiovascular: Normal rate and regular rhythm.   Pulmonary/Chest: Effort normal and breath sounds normal.  Abdominal: Soft. Bowel sounds are normal. There is no tenderness.  Genitourinary:  Genitourinary Comments: Normal external genitalia. Pretty significant vaginal bleeding but no clots in the vaginal vault. No cervical motion tenderness no uterine tenderness no adnexal tenderness. No discharge.  Musculoskeletal: Normal range of motion.  Neurological: She is alert and oriented to person, place, and time. No cranial nerve deficit or  sensory deficit. She exhibits normal muscle tone. Coordination normal.  Skin: Skin is warm.  Nursing note and vitals reviewed.    ED Treatments / Results  Labs (all labs ordered are listed, but only abnormal results are displayed) Labs Reviewed  CBC WITH DIFFERENTIAL/PLATELET - Abnormal; Notable for the following:       Result Value   Hemoglobin 9.7 (*)    HCT 29.6 (*)    MCV 70.8 (*)    MCH 23.2 (*)    RDW 16.2 (*)    All other components within normal limits  BASIC METABOLIC PANEL - Abnormal; Notable for the following:    Potassium 3.3 (*)    Glucose, Bld 102 (*)    All other components within normal limits  HCG, SERUM, QUALITATIVE  TYPE AND SCREEN    EKG  EKG Interpretation None       Radiology No results found.  Procedures Procedures (including critical care time)  Medications Ordered in ED Medications - No data to display   Initial Impression / Assessment and Plan / ED Course  I have reviewed the triage vital signs and the nursing notes.  Pertinent labs & imaging results that were available during my care of the patient were reviewed by me and considered in my medical decision making (see chart for details).     The patient was significant vaginal bleeding. Also with anemia hemoglobin in the 9 range. Patient's hemoglobin has been around 10 in the past. Patient that symptomatic with standing here. Patient will be started on Megace regiment. Discussed with Dr. Elonda [facility]ky [facility]. They will follow her up in the office next week.  Patient stable for discharge home.  Final Clinical Impressions(s) / ED Diagnoses   Final diagnoses:  Abnormal uterine bleeding  Anemia, unspecified type    New Prescriptions New Prescriptions   MEGESTROL (MEGACE) 40 MG TABLET    Take 1 tablet (40 mg total) by mouth daily. Take 1 tablet 3 times a day for the first 5 days. Then 2 tablets twice a day for the next 5 days. Then 1 tablet daily     Fredia Sorrow,  MD 02/26/17 1541

## 2017-02-26 NOTE — ED Triage Notes (Signed)
Vaginal bleeding over a week, lightheaded

## 2017-03-09 ENCOUNTER — Encounter: Payer: Self-pay | Admitting: Obstetrics & Gynecology

## 2017-03-09 ENCOUNTER — Ambulatory Visit (INDEPENDENT_AMBULATORY_CARE_PROVIDER_SITE_OTHER): Payer: Commercial Managed Care - PPO | Admitting: Obstetrics & Gynecology

## 2017-03-09 VITALS — BP 130/84 | HR 72 | Ht 67.0 in | Wt 200.0 lb

## 2017-03-09 DIAGNOSIS — D259 Leiomyoma of uterus, unspecified: Secondary | ICD-10-CM | POA: Diagnosis not present

## 2017-03-09 DIAGNOSIS — D500 Iron deficiency anemia secondary to blood loss (chronic): Secondary | ICD-10-CM | POA: Diagnosis not present

## 2017-03-09 DIAGNOSIS — N921 Excessive and frequent menstruation with irregular cycle: Secondary | ICD-10-CM

## 2017-03-09 MED ORDER — MEGESTROL ACETATE 40 MG PO TABS: ORAL_TABLET | ORAL | 3 refills | Status: DC

## 2017-03-09 NOTE — Progress Notes (Signed)
Chief Complaint  Patient presents with  . Follow-up    was seen at [facility] ER with abnormal bleeding and passing clots; was prescribed Megace      Blood pressure 130/84, pulse 72, height 5\' 7"  (1.702 m), weight 200 lb (90.7 kg), last menstrual period 02/19/2017.  41 y.o. [id number] Patient's last menstrual period was 02/19/2017 (approximate). The current method of family planning is tubal ligation.  Outpatient Encounter Prescriptions as of 03/09/2017  Medication Sig  . aspirin EC 81 MG tablet Take 81 mg by mouth daily.  . Cranberry 500 MG CHEW Chew by mouth 2 (two) times daily.  Marland Kitchen docusate sodium (COLACE) 100 MG capsule Take 100 mg by mouth daily.  Marland Kitchen ibuprofen (ADVIL,MOTRIN) 800 MG tablet Take 800 mg by mouth every 8 (eight) hours as needed.  . iron polysaccharides (NIFEREX) 150 MG capsule Take 150 mg by mouth daily.  Marland Kitchen lisinopril-hydrochlorothiazide (PRINZIDE,ZESTORETIC) 10-12.5 MG per tablet Take 1 tablet by mouth daily.    . megestrol (MEGACE) 40 MG tablet Take 1 tablet (40 mg total) by mouth daily. Take 1 tablet 3 times a day for the first 5 days. Then 2 tablets twice a day for the next 5 days. Then 1 tablet daily (Patient taking differently: Take 40 mg by mouth 2 (two) times daily. Take 1 tablet 3 times a day for the first 5 days. Then 2 tablets twice a day for the next 5 days. Then 1 tablet daily)  . [DISCONTINUED] megestrol (MEGACE) 40 MG tablet 3 tablets a day for 5 days, 2 tablets a day for 5 days then 1 tablet daily   No facility-administered encounter medications on file as of 03/09/2017.     Subjective Patient comes in not complaining of increasingly heavy.  This last period was the worst ever It has been getting worse for some time She was seen at [facility] ED with the bleeding and passing clots and was given Megace at my prompting Her hemoglobin was 9.7 with an MCV of 70 which really pointed to the fact that this is a acute on chronic vaginal bleeding  issue Responded nicely to the Megace and is currently amenorrheic She has a sonogram which is normal and her pelvic exam is also normal to this represents some menorrhagia without uterine or endometrial pathology Her last Pap was normal  Objective General [facility] female NAD Vulva:  normal appearing vulva with no masses, tenderness or lesions Vagina:  normal mucosa, no discharge Cervix:  no cervical motion tenderness and no lesions Uterus:  normal size, contour, position, consistency, mobility, non-tender Adnexa: ovaries:present,  normal adnexa in size, nontender and no masses   Pertinent ROS No burning with urination, frequency or urgency No nausea, vomiting or diarrhea Nor fever chills or other constitutional symptoms   Labs or studies Reviewed labs fro [facility] ED    Impression Diagnoses this Encounter::   ICD-10-CM   1. Menometrorrhagia N92.1   2. 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 Uterine leiomyoma, unspecified location D25.9     Established relevant diagnosis(es):   Plan/Recommendations: Meds ordered this encounter  Medications  . Cranberry 500 MG CHEW    Sig: Chew by mouth 2 (two) times daily.  Marland Kitchen DISCONTD: megestrol (MEGACE) 40 MG tablet    Sig: 3 tablets a day for 5 days, 2 tablets a day for 5 days then 1 tablet daily    Dispense:  45 tablet    Refill:  3  Labs or Scans Ordered: No orders of the defined types were placed in this encounter.   Management:: Hysteroscopy D&C endometrial ablation using NovaSure For menometrorrhagia with iron deficiency anemia as a result without significant endometrial pathology  Follow up Return in about 3 weeks (around 04/01/2017) for [place], with Dr Elonda Husky.        Face to face time:  30 minutes  Greater than 50% of the visit time was spent in counseling and coordination of care with the patient.  The summary and outline of the counseling and care coordination is summarized in the note above.   All questions  were answered.  Past Medical History:  Diagnosis Date  . Diabetes mellitus without complication (H[facility])    pre-diabetes  . Fibroids   . Hypertension   . Ovarian cyst   . Stroke [facility]) august 2011   after gall bladder surgery    Past Surgical History:  Procedure Laterality Date  . CHOLECYSTECTOMY    . TUBAL LIGATION      OB History    Gravida Para Term Preterm AB Living   3 2 2   1 2    SAB TAB Ectopic Multiple Live Births   1       2      Allergies  Allergen Reactions  . Vicodin [Hydrocodone-Acetaminophen] Nausea And Vomiting and Other (See Comments)    dizziness    Social History   Social History  . Marital status: Married    Spouse name: N/A  . Number of children: N/A  . Years of education: N/A   Social History Main Topics  . Smoking status: Former Smoker    Packs/day: 0.25    Years: 4.00    Types: Cigarettes, Cigars  . Smokeless tobacco: Never Used  . Alcohol use No  . Drug use: No  . Sexual activity: Not Currently    Birth control/ protection: Surgical     Comment: tubal   Other Topics Concern  . None   Social History Narrative  . None    Family History  Problem Relation Age of Onset  . Hypertension Mother   . Non-Hodgkin's lymphoma Sister   . Other Paternal Grandmother        heart issues  . Hypertension Brother   . Diabetes Brother   . Stroke Brother   . Fibroids Sister

## 2017-03-18 NOTE — Patient Instructions (Signed)
Chloe Marks  03/18/2017     @PREFPERIOPPHARMACY @   Your procedure is scheduled on  03/24/2017 .  Report to [name] at  710  A.M.  Call this number if you have problems the morning of surgery:  [phone]   Remember:  Do not eat food or drink liquids after midnight.  Take these medicines the morning of surgery with A SIP OF WATER  Lisinopril.   Do not wear jewelry, make-up or nail polish.  Do not wear lotions, powders, or perfumes, or deoderant.  Do not shave 48 hours prior to surgery.  Men may shave face and neck.  Do not bring valuables to the hospital.  [facility] is not responsible for any belongings or valuables.  Contacts, dentures or bridgework may not be worn into surgery.  Leave your suitcase in the car.  After surgery it may be brought to your room.  For patients admitted to the hospital, discharge time will be determined by your treatment team.  Patients discharged the day of surgery will not be allowed to drive home.   Name and phone number of your driver:   family Special instructions:  None  Please read over the following fact sheets that you were given. Anesthesia Post-op Instructions and Care and Recovery After Surgery       Dilation and Curettage or Vacuum Curettage Dilation and curettage (D&C) and vacuum curettage are minor procedures. A D&C involves stretching (dilation) the cervix and scraping (curettage) the inside lining of the uterus (endometrium). During a D&C, tissue is gently scraped from the endometrium, starting from the top portion of the uterus down to the lowest part of the uterus (cervix). During a vacuum curettage, the lining and tissue in the uterus are removed with the use of gentle suction. Curettage may be performed to either diagnose or treat a problem. As a diagnostic procedure, curettage is performed to examine tissues from the uterus. A diagnostic curettage may be done if you have:  Irregular  bleeding in the uterus.  Bleeding with the development of clots.  Spotting between menstrual periods.  Prolonged menstrual periods or other abnormal bleeding.  Bleeding after menopause.  No menstrual period (amenorrhea).  A change in size and shape of the uterus.  Abnormal endometrial cells discovered during a Pap test.  As a treatment procedure, curettage may be performed for the following reasons:  Removal of an IUD (intrauterine device).  Removal of retained placenta after giving birth.  Abortion.  Miscarriage.  Removal of endometrial polyps.  Removal of uncommon types of noncancerous lumps (fibroids).  Tell a health care provider about:  Any allergies you have, including allergies to prescribed medicine or latex.  All medicines you are taking, including vitamins, herbs, eye drops, creams, and over-the-counter medicines. This is especially important if you take any blood-thinning medicine. Bring a list of all of your medicines to your appointment.  Any problems you or family members have had with anesthetic medicines.  Any blood disorders you have.  Any surgeries you have had.  Your medical history and any medical conditions you have.  Whether you are pregnant or may be pregnant.  Recent vaginal infections you have had.  Recent menstrual periods, bleeding problems you have had, and what form of birth control (contraception) you use. What are the risks? Generally, this is a safe procedure. However, problems may occur, including:  Infection.  Heavy vaginal  bleeding.  Allergic reactions to medicines.  Damage to the cervix or other structures or organs.  Development of scar tissue (adhesions) inside the uterus, which can cause abnormal amounts of menstrual bleeding. This may make it harder to get pregnant in the future.  A hole (perforation) or puncture in the uterine wall. This is rare.  What happens before the procedure? Staying hydrated Follow  instructions from your health care provider about hydration, which may include:  Up to 2 hours before the procedure - you may continue to drink clear liquids, such as water, clear fruit juice, black coffee, and plain tea.  Eating and drinking restrictions Follow instructions from your health care provider about eating and drinking, which may include:  8 hours before the procedure - stop eating heavy meals or foods such as meat, fried foods, or fatty foods.  6 hours before the procedure - stop eating light meals or foods, such as toast or cereal.  6 hours before the procedure - stop drinking milk or drinks that contain milk.  2 hours before the procedure - stop drinking clear liquids. If your health care provider told you to take your medicine(s) on the day of your procedure, take them with only a sip of water.  Medicines  Ask your health care provider about: ? Changing or stopping your regular medicines. This is especially important if you are taking diabetes medicines or blood thinners. ? Taking medicines such as aspirin and ibuprofen. These medicines can thin your blood. Do not take these medicines before your procedure if your health care provider instructs you not to.  You may be given antibiotic medicine to help prevent infection. General instructions  For 24 hours before your procedure, do not: ? Douche. ? Use tampons. ? Use medicines, creams, or suppositories in the vagina. ? Have sexual intercourse.  You may be given a pregnancy test on the day of the procedure.  Plan to have someone take you home from the hospital or clinic.  You may have a blood or urine sample taken.  If you will be going home right after the procedure, plan to have someone with you for 24 hours. What happens during the procedure?  To reduce your risk of infection: ? Your health care team will wash or sanitize their hands. ? Your skin will be washed with soap.  An IV tube will be inserted into  one of your veins.  You will be given one of the following: ? A medicine that numbs the area in and around the cervix (local anesthetic). ? A medicine to make you fall asleep (general anesthetic).  You will lie down on your back, with your feet in foot rests (stirrups).  The size and position of your uterus will be checked.  A lubricated instrument (speculum or Sims retractor) will be inserted into the back side of your vagina. The speculum will be used to hold apart the walls of your vagina so your health care provider can see your cervix.  A tool (tenaculum) will be attached to the lip of the cervix to stabilize it.  Your cervix will be softened and dilated. This may be done by: ? Taking a medicine. ? Having tapered dilators or thin rods (laminaria) or gradual widening instruments (tapered dilators) inserted into your cervix.  A small, sharp, curved instrument (curette) will be used to scrape a small amount of tissue or cells from the endometrium or cervical canal. In some cases, gentle suction is applied with the curette.  The curette will then be removed. The cells will be taken to a lab for testing. The procedure may vary among health care providers and hospitals. What happens after the procedure?  You may have mild cramping, backache, pain, and light bleeding or spotting. You may pass small blood clots from your vagina.  You may have to wear compression stockings. These stockings help to prevent blood clots and reduce swelling in your legs.  Your blood pressure, heart rate, breathing rate, and blood oxygen level will be monitored until the medicines you were given have worn off. Summary  Dilation and curettage (D&C) involves stretching (dilation) the cervix and scraping (curettage) the inside lining of the uterus (endometrium).  After the procedure, you may have mild cramping, backache, pain, and light bleeding or spotting. You may pass small blood clots from your vagina.  Plan  to have someone take you home from the hospital or clinic. This information is not intended to replace advice given to you by your health care provider. Make sure you discuss any questions you have with your health care provider. Document Released: 06/29/2005 Document Revised: 03/15/2016 Document Reviewed: 03/15/2016 Elsevier Interactive Patient Education  2018 [employer].  Dilation and Curettage or Vacuum Curettage, Care After These instructions give you information about caring for yourself after your procedure. Your doctor may also give you more specific instructions. Call your doctor if you have any problems or questions after your procedure. Follow these instructions at home: Activity  Do not drive or use heavy machinery while taking prescription pain medicine.  For 24 hours after your procedure, avoid driving.  Take short walks often, followed by rest periods. Ask your doctor what activities are safe for you. After one or two days, you may be able to return to your normal activities.  Do not lift anything that is heavier than 10 lb (4.5 kg) until your doctor approves.  For at least 2 weeks, or as long as told by your doctor: ? Do not douche. ? Do not use tampons. ? Do not have sex. General instructions  Take over-the-counter and prescription medicines only as told by your doctor. This is very important if you take blood thinning medicine.  Do not take baths, swim, or use a hot tub until your doctor approves. Take showers instead of baths.  Wear compression stockings as told by your doctor.  It is up to you to get the results of your procedure. Ask your doctor when your results will be ready.  Keep all follow-up visits as told by your doctor. This is important. Contact a doctor if:  You have very bad cramps that get worse or do not get better with medicine.  You have very bad pain in your belly (abdomen).  You cannot drink fluids without throwing up (vomiting).  You  get pain in a different part of the area between your belly and thighs (pelvis).  You have bad-smelling discharge from your vagina.  You have a rash. Get help right away if:  You are bleeding a lot from your vagina. A lot of bleeding means soaking more than one sanitary pad in an hour, for 2 hours in a row.  You have clumps of blood (blood clots) coming from your vagina.  You have a fever or chills.  Your belly feels very tender or hard.  You have chest pain.  You have trouble breathing.  You cough up blood.  You feel dizzy.  You feel light-headed.  You pass out (faint).  You have pain in your neck or shoulder area. Summary  Take short walks often, followed by rest periods. Ask your doctor what activities are safe for you. After one or two days, you may be able to return to your normal activities.  Do not lift anything that is heavier than 10 lb (4.5 kg) until your doctor approves.  Do not take baths, swim, or use a hot tub until your doctor approves. Take showers instead of baths.  Contact your doctor if you have any symptoms of infection, like bad-smelling discharge from your vagina. This information is not intended to replace advice given to you by your health care provider. Make sure you discuss any questions you have with your health care provider. Document Released: 04/07/2008 Document Revised: 03/16/2016 Document Reviewed: 03/16/2016 Elsevier Interactive Patient Education  2017 [place]. Hysteroscopy Hysteroscopy is a procedure used for looking inside the womb (uterus). It may be done for various reasons, including:  To evaluate abnormal bleeding, fibroid (benign, noncancerous) tumors, polyps, scar tissue (adhesions), and possibly cancer of the uterus.  To look for lumps (tumors) and other uterine growths.  To look for causes of why a woman cannot get pregnant (infertility), causes of recurrent loss of pregnancy (miscarriages), or a lost intrauterine device  (IUD).  To perform a sterilization by blocking the fallopian tubes from inside the uterus.  In this procedure, a thin, flexible tube with a tiny light and camera on the end of it (hysteroscope) is used to look inside the uterus. A hysteroscopy should be done right after a menstrual period to be sure you are not pregnant. LET [facility] CARE PROVIDER KNOW ABOUT:  Any allergies you have.  All medicines you are taking, including vitamins, herbs, eye drops, creams, and over-the-counter medicines.  Previous problems you or members of your family have had with the use of anesthetics.  Any blood disorders you have.  Previous surgeries you have had.  Medical conditions you have. RISKS AND COMPLICATIONS Generally, this is a safe procedure. However, as with any procedure, complications can occur. Possible complications include:  Putting a hole in the uterus.  Excessive bleeding.  Infection.  Damage to the cervix.  Injury to other organs.  Allergic reaction to medicines.  Too much fluid used in the uterus for the procedure.  BEFORE THE PROCEDURE  Ask your health care provider about changing or stopping any regular medicines.  Do not take aspirin or blood thinners for 1 week before the procedure, or as directed by your health care provider. These can cause bleeding.  If you smoke, do not smoke for 2 weeks before the procedure.  In some cases, a medicine is placed in the cervix the day before the procedure. This medicine makes the cervix have a larger opening (dilate). This makes it easier for the instrument to be inserted into the uterus during the procedure.  Do not eat or drink anything for at least 8 hours before the surgery.  Arrange for someone to take you home after the procedure. PROCEDURE  You may be given a medicine to relax you (sedative). You may also be given one of the following: ? A medicine that numbs the area around the cervix (local anesthetic). ? A medicine  that makes you sleep through the procedure (general anesthetic).  The hysteroscope is inserted through the vagina into the uterus. The camera on the hysteroscope sends a picture to a TV screen. This gives the surgeon a good view inside the uterus.  During  the procedure, air or a liquid is put into the uterus, which allows the surgeon to see better.  Sometimes, tissue is gently scraped from inside the uterus. These tissue samples are sent to a lab for testing. What to expect after the procedure  If you had a general anesthetic, you may be groggy for a couple hours after the procedure.  If you had a local anesthetic, you will be able to go home as soon as you are stable and feel ready.  You may have some cramping. This normally lasts for a couple days.  You may have bleeding, which varies from light spotting for a few days to menstrual-like bleeding for 3-7 days. This is normal.  If your test results are not back during the visit, make an appointment with your health care provider to find out the results. This information is not intended to replace advice given to you by your health care provider. Make sure you discuss any questions you have with your health care provider. Document Released: 10/05/2000 Document Revised: 12/05/2015 Document Reviewed: 01/26/2013 Elsevier Interactive Patient Education  2017 [place]. Hysteroscopy, Care After Refer to this sheet in the next few weeks. These instructions provide you with information on caring for yourself after your procedure. Your health care provider may also give you more specific instructions. Your treatment has been planned according to current medical practices, but problems sometimes occur. Call your health care provider if you have any problems or questions after your procedure. What can I expect after the procedure? After your procedure, it is typical to have the following:  You may have some cramping. This normally lasts for a  couple days.  You may have bleeding. This can vary from light spotting for a few days to menstrual-like bleeding for 3-7 days.  Follow these instructions at home:  Rest for the first 1-2 days after the procedure.  Only take over-the-counter or prescription medicines as directed by your health care provider. Do not take aspirin. It can increase the chances of bleeding.  Take showers instead of baths for 2 weeks or as directed by your health care provider.  Do not drive for 24 hours or as directed.  Do not drink alcohol while taking pain medicine.  Do not use tampons, douche, or have sexual intercourse for 2 weeks or until your health care provider says it is okay.  Take your temperature twice a day for 4-5 days. Write it down each time.  Follow your health care provider's advice about diet, exercise, and lifting.  If you develop constipation, you may: ? Take a mild laxative if your health care provider approves. ? Add bran foods to your diet. ? Drink enough fluids to keep your urine clear or pale yellow.  Try to have someone with you or available to you for the first 24-48 hours, especially if you were given a general anesthetic.  Follow up with your health care provider as directed. Contact a health care provider if:  You feel dizzy or lightheaded.  You feel sick to your stomach (nauseous).  You have abnormal vaginal discharge.  You have a rash.  You have pain that is not controlled with medicine. Get help right away if:  You have bleeding that is heavier than a normal menstrual period.  You have a fever.  You have increasing cramps or pain, not controlled with medicine.  You have new belly (abdominal) pain.  You pass out.  You have pain in the tops of your  shoulders (shoulder strap areas).  You have shortness of breath. This information is not intended to replace advice given to you by your health care provider. Make sure you discuss any questions you have  with your health care provider. Document Released: 04/19/2013 Document Revised: 12/05/2015 Document Reviewed: 01/26/2013 Elsevier Interactive Patient Education  2017 [place].  Endometrial Ablation Endometrial ablation is a procedure that destroys the thin inner layer of the lining of the uterus (endometrium). This procedure may be done:  To stop heavy periods.  To stop bleeding that is causing anemia.  To control irregular bleeding.  To treat bleeding caused by small tumors (fibroids) in the endometrium.  This procedure is often an alternative to major surgery, such as removal of the uterus and cervix (hysterectomy). As a result of this procedure:  You may not be able to have children. However, if you are premenopausal (you have not gone through menopause): ? You may still have a small chance of getting pregnant. ? You will need to use a reliable method of birth control after the procedure to prevent pregnancy.  You may stop having a menstrual period, or you may have only a small amount of bleeding during your period. Menstruation may return several years after the procedure.  Tell a health care provider about:  Any allergies you have.  All medicines you are taking, including vitamins, herbs, eye drops, creams, and over-the-counter medicines.  Any problems you or family members have had with the use of anesthetic medicines.  Any blood disorders you have.  Any surgeries you have had.  Any medical conditions you have. What are the risks? Generally, this is a safe procedure. However, problems may occur, including:  A hole (perforation) in the uterus or bowel.  Infection of the uterus, bladder, or vagina.  Bleeding.  Damage to other structures or organs.  An air bubble in the lung (air embolus).  Problems with pregnancy after the procedure.  Failure of the procedure.  Decreased ability to diagnose cancer in the endometrium.  What happens before the  procedure?  You will have tests of your endometrium to make sure there are no pre-cancerous cells or cancer cells present.  You may have an ultrasound of the uterus.  You may be given medicines to thin the endometrium.  Ask your health care provider about: ? Changing or stopping your regular medicines. This is especially important if you take diabetes medicines or blood thinners. ? Taking medicines such as aspirin and ibuprofen. These medicines can thin your blood. Do not take these medicines before your procedure if your doctor tells you not to.  Plan to have someone take you home from the hospital or clinic. What happens during the procedure?  You will lie on an exam table with your feet and legs supported as in a pelvic exam.  To lower your risk of infection: ? Your health care team will wash or sanitize their hands and put on germ-free (sterile) gloves. ? Your genital area will be washed with soap.  An IV tube will be inserted into one of your veins.  You will be given a medicine to help you relax (sedative).  A surgical instrument with a light and camera (resectoscope) will be inserted into your vagina and moved into your uterus. This allows your surgeon to see inside your uterus.  Endometrial tissue will be removed using one of the following methods: ? Radiofrequency. This method uses a radiofrequency-alternating electric current to remove the endometrium. ? Cryotherapy. This  method uses extreme cold to freeze the endometrium. ? Heated-free liquid. This method uses a heated saltwater (saline) solution to remove the endometrium. ? Microwave. This method uses high-energy microwaves to heat up the endometrium and remove it. ? Thermal balloon. This method involves inserting a catheter with a balloon tip into the uterus. The balloon tip is filled with heated fluid to remove the endometrium. The procedure may vary among health care providers and hospitals. What happens after the  procedure?  Your blood pressure, heart rate, breathing rate, and blood oxygen level will be monitored until the medicines you were given have worn off.  As tissue healing occurs, you may notice vaginal bleeding for 4-6 weeks after the procedure. You may also experience: ? Cramps. ? Thin, watery vaginal discharge that is light pink or brown in color. ? A need to urinate more frequently than usual. ? Nausea.  Do not drive for 24 hours if you were given a sedative.  Do not have sex or insert anything into your vagina until your health care provider approves. Summary  Endometrial ablation is done to treat the many causes of heavy menstrual bleeding.  The procedure may be done only after medications have been tried to control the bleeding.  Plan to have someone take you home from the hospital or clinic. This information is not intended to replace advice given to you by your health care provider. Make sure you discuss any questions you have with your health care provider. Document Released: 05/08/2004 Document Revised: 07/16/2016 Document Reviewed: 07/16/2016 Elsevier Interactive Patient Education  2017 [place] Anesthesia, Adult General anesthesia is the use of medicines to make a person "go to sleep" (be unconscious) for a medical procedure. General anesthesia is often recommended when a procedure:  Is long.  Requires you to be still or in an unusual position.  Is major and can cause you to lose blood.  Is impossible to do without general anesthesia.  The medicines used for general anesthesia are called general anesthetics. In addition to making you sleep, the medicines:  Prevent pain.  Control your blood pressure.  Relax your muscles.  Tell a health care provider about:  Any allergies you have.  All medicines you are taking, including vitamins, herbs, eye drops, creams, and over-the-counter medicines.  Any problems you or family members have had with  anesthetic medicines.  Types of anesthetics you have had in the past.  Any bleeding disorders you have.  Any surgeries you have had.  Any medical conditions you have.  Any history of heart or lung conditions, such as heart failure, sleep apnea, or chronic obstructive pulmonary disease (COPD).  Whether you are pregnant or may be pregnant.  Whether you use tobacco, alcohol, marijuana, or street drugs.  Any history of Armed forces logistics/support/administrative officer.  Any history of depression or anxiety. What are the risks? Generally, this is a safe procedure. However, problems may occur, including:  Allergic reaction to anesthetics.  Lung and heart problems.  Inhaling food or liquids from your stomach into your lungs (aspiration).  Injury to nerves.  Waking up during your procedure and being unable to move (rare).  Extreme agitation or a state of mental confusion (delirium) when you wake up from the anesthetic.  Air in the bloodstream, which can lead to stroke.  These problems are more likely to develop if you are having a major surgery or if you have an advanced medical condition. You can prevent some of these complications by answering all  of your health care provider's questions thoroughly and by following all pre-procedure instructions. General anesthesia can cause side effects, including:  Nausea or vomiting  A sore throat from the breathing tube.  Feeling cold or shivery.  Feeling tired, washed out, or achy.  Sleepiness or drowsiness.  Confusion or agitation.  What happens before the procedure? Staying hydrated Follow instructions from your health care provider about hydration, which may include:  Up to 2 hours before the procedure - you may continue to drink clear liquids, such as water, clear fruit juice, black coffee, and plain tea.  Eating and drinking restrictions Follow instructions from your health care provider about eating and drinking, which may include:  8 hours before the  procedure - stop eating heavy meals or foods such as meat, fried foods, or fatty foods.  6 hours before the procedure - stop eating light meals or foods, such as toast or cereal.  6 hours before the procedure - stop drinking milk or drinks that contain milk.  2 hours before the procedure - stop drinking clear liquids.  Medicines  Ask your health care provider about: ? Changing or stopping your regular medicines. This is especially important if you are taking diabetes medicines or blood thinners. ? Taking medicines such as aspirin and ibuprofen. These medicines can thin your blood. Do not take these medicines before your procedure if your health care provider instructs you not to. ? Taking new dietary supplements or medicines. Do not take these during the week before your procedure unless your health care provider approves them.  If you are told to take a medicine or to continue taking a medicine on the day of the procedure, take the medicine with sips of water. General instructions   Ask if you will be going home the same day, the following day, or after a longer hospital stay. ? Plan to have someone take you home. ? Plan to have someone stay with you for the first 24 hours after you leave the hospital or clinic.  For 3-6 weeks before the procedure, try not to use any tobacco products, such as cigarettes, chewing tobacco, and e-cigarettes.  You may brush your teeth on the morning of the procedure, but make sure to spit out the toothpaste. What happens during the procedure?  You will be given anesthetics through a mask and through an IV tube in one of your veins.  You may receive medicine to help you relax (sedative).  As soon as you are asleep, a breathing tube may be used to help you breathe.  An anesthesia specialist will stay with you throughout the procedure. He or she will help keep you comfortable and safe by continuing to give you medicines and adjusting the amount of  medicine that you get. He or she will also watch your blood pressure, pulse, and oxygen levels to make sure that the anesthetics do not cause any problems.  If a breathing tube was used to help you breathe, it will be removed before you wake up. The procedure may vary among health care providers and hospitals. What happens after the procedure?  You will wake up, often slowly, after the procedure is complete, usually in a recovery area.  Your blood pressure, heart rate, breathing rate, and blood oxygen level will be monitored until the medicines you were given have worn off.  You may be given medicine to help you calm down if you feel anxious or agitated.  If you will be going home the  same day, your health care provider may check to make sure you can stand, drink, and urinate.  Your health care providers will treat your pain and side effects before you go home.  Do not drive for 24 hours if you received a sedative.  You may: ? Feel nauseous and vomit. ? Have a sore throat. ? Have mental slowness. ? Feel cold or shivery. ? Feel sleepy. ? Feel tired. ? Feel sore or achy, even in parts of your body where you did not have surgery. This information is not intended to replace advice given to you by your health care provider. Make sure you discuss any questions you have with your health care provider. Document Released: 10/06/2007 Document Revised: 12/10/2015 Document Reviewed: 06/13/2015 Elsevier Interactive Patient Education  2018 [place] Anesthesia, Adult, Care After These instructions provide you with information about caring for yourself after your procedure. Your health care provider may also give you more specific instructions. Your treatment has been planned according to current medical practices, but problems sometimes occur. Call your health care provider if you have any problems or questions after your procedure. What can I expect after the procedure? After the  procedure, it is common to have:  Vomiting.  A sore throat.  Mental slowness.  It is common to feel:  Nauseous.  Cold or shivery.  Sleepy.  Tired.  Sore or achy, even in parts of your body where you did not have surgery.  Follow these instructions at home: For at least 24 hours after the procedure:  Do not: ? Participate in activities where you could fall or become injured. ? Drive. ? Use heavy machinery. ? Drink alcohol. ? Take sleeping pills or medicines that cause drowsiness. ? Make important decisions or sign legal documents. ? Take care of children on your own.  Rest. Eating and drinking  If you vomit, drink water, juice, or soup when you can drink without vomiting.  Drink enough fluid to keep your urine clear or pale yellow.  Make sure you have little or no nausea before eating solid foods.  Follow the diet recommended by your health care provider. General instructions  Have a responsible adult stay with you until you are awake and alert.  Return to your normal activities as told by your health care provider. Ask your health care provider what activities are safe for you.  Take over-the-counter and prescription medicines only as told by your health care provider.  If you smoke, do not smoke without supervision.  Keep all follow-up visits as told by your health care provider. This is important. Contact a health care provider if:  You continue to have nausea or vomiting at home, and medicines are not helpful.  You cannot drink fluids or start eating again.  You cannot urinate after 8-12 hours.  You develop a skin rash.  You have fever.  You have increasing redness at the site of your procedure. Get help right away if:  You have difficulty breathing.  You have chest pain.  You have unexpected bleeding.  You feel that you are having a life-threatening or urgent problem. This information is not intended to replace advice given to you by your  health care provider. Make sure you discuss any questions you have with your health care provider. Document Released: 10/05/2000 Document Revised: 12/02/2015 Document Reviewed: 06/13/2015 Elsevier Interactive Patient Education  [employer].

## 2017-03-22 ENCOUNTER — Encounter (HOSPITAL_COMMUNITY)
Admission: RE | Admit: 2017-03-22 | Discharge: 2017-03-22 | Disposition: A | Discharge status: Home / Self Care | Payer: Commercial Managed Care - PPO | Source: Ambulatory Visit | Attending: Obstetrics & Gynecology | Admitting: Obstetrics & Gynecology

## 2017-03-22 ENCOUNTER — Encounter (HOSPITAL_COMMUNITY): Payer: Self-pay

## 2017-03-22 ENCOUNTER — Other Ambulatory Visit: Payer: Self-pay | Admitting: Obstetrics & Gynecology

## 2017-03-22 DIAGNOSIS — N921 Excessive and frequent menstruation with irregular cycle: Secondary | ICD-10-CM | POA: Diagnosis not present

## 2017-03-22 DIAGNOSIS — Z01812 Encounter for preprocedural laboratory examination: Secondary | ICD-10-CM | POA: Diagnosis not present

## 2017-03-22 DIAGNOSIS — Z01810 Encounter for preprocedural cardiovascular examination: Secondary | ICD-10-CM | POA: Diagnosis not present

## 2017-03-22 DIAGNOSIS — N946 Dysmenorrhea, unspecified: Secondary | ICD-10-CM | POA: Diagnosis not present

## 2017-03-22 DIAGNOSIS — D500 Iron deficiency anemia secondary to blood loss (chronic): Secondary | ICD-10-CM | POA: Diagnosis not present

## 2017-03-22 HISTORY — DX: Cardiac murmur, unspecified: R01.1

## 2017-03-22 HISTORY — DX: Prediabetes: R73.03

## 2017-03-22 HISTORY — DX: Anemia, unspecified: D64.9

## 2017-03-22 HISTORY — DX: Family history of other specified conditions: Z84.89

## 2017-03-22 LAB — COMPREHENSIVE METABOLIC PANEL
ALBUMIN: 4.1 g/dL (ref 3.5–5.0)
ALK PHOS: 47 U/L (ref 38–126)
ALT: 17 U/L (ref 14–54)
AST: 43 U/L — AB (ref 15–41)
Anion gap: 8 (ref 5–15)
BILIRUBIN TOTAL: 0.5 mg/dL (ref 0.3–1.2)
BUN: 17 mg/dL (ref 6–20)
CALCIUM: 9.1 mg/dL (ref 8.9–10.3)
CO2: 27 mmol/L (ref 22–32)
CREATININE: 1.06 mg/dL — AB (ref 0.44–1.00)
Chloride: 103 mmol/L (ref 101–111)
GFR calc Af Amer: >60 mL/min (ref >60)
GFR calc non Af Amer: >60 mL/min (ref >60)
GLUCOSE: 103 mg/dL — AB (ref 65–99)
Potassium: 3.2 mmol/L — ABNORMAL LOW (ref 3.5–5.1)
SODIUM: 138 mmol/L (ref 135–145)
TOTAL PROTEIN: 7.8 g/dL (ref 6.5–8.1)

## 2017-03-22 LAB — CBC
HEMATOCRIT: 30 % — AB (ref 36.0–46.0)
HEMOGLOBIN: 9.6 g/dL — AB (ref 12.0–15.0)
MCH: 22.6 pg — ABNORMAL LOW (ref 26.0–34.0)
MCHC: 32 g/dL (ref 30.0–36.0)
MCV: 70.6 fL — ABNORMAL LOW (ref 78.0–100.0)
Platelets: 334 10*3/uL (ref 150–400)
RBC: 4.25 MIL/uL (ref 3.87–5.11)
RDW: 16.4 % — ABNORMAL HIGH (ref 11.5–15.5)
WBC: 6.1 10*3/uL (ref 4.0–10.5)

## 2017-03-22 LAB — URINALYSIS, ROUTINE W REFLEX MICROSCOPIC
BACTERIA UA: NONE SEEN
BILIRUBIN URINE: NEGATIVE
GLUCOSE, UA: NEGATIVE mg/dL
HGB URINE DIPSTICK: NEGATIVE
Ketones, ur: NEGATIVE mg/dL
NITRITE: NEGATIVE
PROTEIN: NEGATIVE mg/dL
Specific Gravity, Urine: 1.027 (ref 1.005–1.030)
pH: 5 (ref 5.0–8.0)

## 2017-03-22 LAB — RAPID HIV SCREEN (HIV 1/2 AB+AG)
HIV 1/2 ANTIBODIES: NONREACTIVE
HIV-1 P24 Antigen - HIV24: NONREACTIVE

## 2017-03-22 LAB — HCG, QUANTITATIVE, PREGNANCY: hCG, Beta Chain, Quant, S: <1 m[IU]/mL (ref <5)

## 2017-03-23 NOTE — Pre-Procedure Instructions (Signed)
Dr Patsey Berthold and Dr Elonda Husky aware of labs. No orders given at this time.

## 2017-03-24 ENCOUNTER — Encounter (HOSPITAL_COMMUNITY)
Admission: RE | Disposition: A | Discharge status: Home / Self Care | Payer: Self-pay | Source: Ambulatory Visit | Attending: Obstetrics & Gynecology

## 2017-03-24 ENCOUNTER — Ambulatory Visit (HOSPITAL_COMMUNITY): Payer: Commercial Managed Care - PPO | Admitting: Anesthesiology

## 2017-03-24 ENCOUNTER — Ambulatory Visit (HOSPITAL_COMMUNITY)
Admission: RE | Admit: 2017-03-24 | Discharge: 2017-03-24 | Disposition: A | Discharge status: Home / Self Care | Payer: Commercial Managed Care - PPO | Source: Ambulatory Visit | Attending: Obstetrics & Gynecology | Admitting: Obstetrics & Gynecology

## 2017-03-24 ENCOUNTER — Encounter (HOSPITAL_COMMUNITY): Payer: Self-pay | Admitting: Anesthesiology

## 2017-03-24 DIAGNOSIS — N946 Dysmenorrhea, unspecified: Secondary | ICD-10-CM | POA: Insufficient documentation

## 2017-03-24 DIAGNOSIS — D500 Iron deficiency anemia secondary to blood loss (chronic): Secondary | ICD-10-CM | POA: Diagnosis not present

## 2017-03-24 DIAGNOSIS — Z01812 Encounter for preprocedural laboratory examination: Secondary | ICD-10-CM | POA: Insufficient documentation

## 2017-03-24 DIAGNOSIS — N921 Excessive and frequent menstruation with irregular cycle: Secondary | ICD-10-CM | POA: Insufficient documentation

## 2017-03-24 DIAGNOSIS — Z01810 Encounter for preprocedural cardiovascular examination: Secondary | ICD-10-CM | POA: Insufficient documentation

## 2017-03-24 HISTORY — PX: DILITATION & CURRETTAGE/HYSTROSCOPY WITH NOVASURE ABLATION: SHX5568

## 2017-03-24 LAB — GLUCOSE, CAPILLARY: GLUCOSE-CAPILLARY: 91 mg/dL (ref 65–99)

## 2017-03-24 SURGERY — DILATATION & CURETTAGE/HYSTEROSCOPY WITH NOVASURE ABLATION
Anesthesia: General

## 2017-03-24 MED ORDER — OXYCODONE-ACETAMINOPHEN 5-325 MG PO TABS: 1.0000 | ORAL_TABLET | ORAL | 0 refills | Status: DC | PRN

## 2017-03-24 MED ORDER — FENTANYL CITRATE (PF) 100 MCG/2ML IJ SOLN
25.0000 ug | INTRAMUSCULAR | Status: DC | PRN
  Administered 2017-03-24: 50 ug via INTRAVENOUS
  Filled 2017-03-24: qty 2

## 2017-03-24 MED ORDER — ONDANSETRON 8 MG PO TBDP: 8.0000 mg | ORAL_TABLET | Freq: Three times a day (TID) | ORAL | 0 refills | Status: DC | PRN

## 2017-03-24 MED ORDER — FENTANYL CITRATE (PF) 100 MCG/2ML IJ SOLN
INTRAMUSCULAR | Status: DC | PRN
  Administered 2017-03-24 (×2): 25 ug via INTRAVENOUS
  Administered 2017-03-24: 50 ug via INTRAVENOUS

## 2017-03-24 MED ORDER — LACTATED RINGERS IV SOLN
INTRAVENOUS | Status: DC
  Administered 2017-03-24: 1000 mL via INTRAVENOUS

## 2017-03-24 MED ORDER — PROPOFOL 10 MG/ML IV BOLUS
INTRAVENOUS | Status: AC
  Filled 2017-03-24: qty 40

## 2017-03-24 MED ORDER — CEFAZOLIN SODIUM-DEXTROSE 2-4 GM/100ML-% IV SOLN
2.0000 g | INTRAVENOUS | Status: AC
  Administered 2017-03-24: 2 g via INTRAVENOUS
  Filled 2017-03-24: qty 100

## 2017-03-24 MED ORDER — MIDAZOLAM HCL 2 MG/2ML IJ SOLN
1.0000 mg | INTRAMUSCULAR | Status: AC
  Administered 2017-03-24: 2 mg via INTRAVENOUS
  Filled 2017-03-24: qty 2

## 2017-03-24 MED ORDER — PROPOFOL 10 MG/ML IV BOLUS
INTRAVENOUS | Status: DC | PRN
  Administered 2017-03-24: 160 mg via INTRAVENOUS
  Administered 2017-03-24: 40 mg via INTRAVENOUS

## 2017-03-24 MED ORDER — FENTANYL CITRATE (PF) 100 MCG/2ML IJ SOLN
INTRAMUSCULAR | Status: AC
  Filled 2017-03-24: qty 2

## 2017-03-24 MED ORDER — KETOROLAC TROMETHAMINE 10 MG PO TABS: 10.0000 mg | ORAL_TABLET | Freq: Three times a day (TID) | ORAL | 0 refills | Status: DC | PRN

## 2017-03-24 MED ORDER — KETOROLAC TROMETHAMINE 30 MG/ML IJ SOLN
30.0000 mg | Freq: Once | INTRAMUSCULAR | Status: AC
  Administered 2017-03-24: 30 mg via INTRAVENOUS
  Filled 2017-03-24: qty 1

## 2017-03-24 SURGICAL SUPPLY — 27 items
ABLATOR ENDOMETRIAL BIPOLAR (ABLATOR) ×3 IMPLANT
BAG HAMPER (MISCELLANEOUS) ×3 IMPLANT
CLOTH BEACON ORANGE TIMEOUT ST (SAFETY) ×3 IMPLANT
COVER LIGHT HANDLE STERIS (MISCELLANEOUS) ×6 IMPLANT
FORMALIN 10 PREFIL 120ML (MISCELLANEOUS) ×3 IMPLANT
GAUZE SPONGE 4X4 16PLY XRAY LF (GAUZE/BANDAGES/DRESSINGS) ×3 IMPLANT
GLOVE BIOGEL PI IND STRL 7.0 (GLOVE) ×2 IMPLANT
GLOVE BIOGEL PI IND STRL 8 (GLOVE) ×1 IMPLANT
GLOVE BIOGEL PI INDICATOR 7.0 (GLOVE) ×4
GLOVE BIOGEL PI INDICATOR 8 (GLOVE) ×2
GLOVE ECLIPSE 8.0 STRL XLNG CF (GLOVE) ×3 IMPLANT
GOWN STRL REUS W/TWL LRG LVL3 (GOWN DISPOSABLE) ×3 IMPLANT
GOWN STRL REUS W/TWL XL LVL3 (GOWN DISPOSABLE) ×3 IMPLANT
INST SET HYSTEROSCOPY (KITS) ×3 IMPLANT
IV NS 1000ML (IV SOLUTION) ×3
IV NS 1000ML BAXH (IV SOLUTION) ×1 IMPLANT
KIT ROOM TURNOVER AP CYSTO (KITS) ×3 IMPLANT
MANIFOLD NEPTUNE II (INSTRUMENTS) ×3 IMPLANT
NS IRRIG 1000ML POUR BTL (IV SOLUTION) ×3 IMPLANT
PACK BASIC III (CUSTOM PROCEDURE TRAY) ×3
PACK SRG BSC III STRL LF ECLPS (CUSTOM PROCEDURE TRAY) ×1 IMPLANT
PAD ARMBOARD 7.5X6 YLW CONV (MISCELLANEOUS) ×3 IMPLANT
PAD TELFA 3X4 1S STER (GAUZE/BANDAGES/DRESSINGS) ×3 IMPLANT
SET BASIN LINEN APH (SET/KITS/TRAYS/PACK) ×3 IMPLANT
SET IRRIG Y TYPE TUR BLADDER L (SET/KITS/TRAYS/PACK) ×3 IMPLANT
SHEET LAVH (DRAPES) ×3 IMPLANT
YANKAUER SUCT BULB TIP 10FT TU (MISCELLANEOUS) ×3 IMPLANT

## 2017-03-24 NOTE — H&P (Signed)
Preoperative History and Physical  Chloe Marks is a 41 y.o. [id number] with No LMP recorded. admitted for a hysteroscopy uterine curettage endometrial ablation.   Here is a copy of her note from her visit 03/09/2017: Patient comes in not complaining of increasingly heavy.  This last period was the worst ever It has been getting worse for some time She was seen at [facility] ED with the bleeding and passing clots and was given Megace at my prompting Her hemoglobin was 9.7 with an MCV of 70 which really pointed to the fact that this is a acute on chronic vaginal bleeding issue Responded nicely to the Megace and is currently amenorrheic She has a sonogram which is normal and her pelvic exam is also normal to this represents some menorrhagia without uterine or endometrial pathology Her last Pap was normal   PMH:    Past Medical History:  Diagnosis Date  . Anemia   . Family history of adverse reaction to anesthesia    PONV  . Fibroids   . Heart murmur   . Hypertension   . Ovarian cyst   . Pre-diabetes   . 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 CHOLECYSTECTOMY    . TUBAL LIGATION      POb/GynH:      OB History    Gravida Para Term Preterm AB Living   3 2 2   1 2    SAB TAB Ectopic Multiple Live Births   1       2      SH:   Social History  Substance Use Topics  . Smoking status: Former Smoker    Packs/day: 0.25    Years: 4.00    Types: Cigarettes, Cigars    Quit date: 03/22/2014  . Smokeless tobacco: Never Used  . Alcohol use No    FH:    Family History  Problem Relation Age of Onset  . Hypertension Mother   . Non-Hodgkin's lymphoma Sister   . Other Paternal Grandmother        heart issues  . Hypertension Brother   . Diabetes Brother   . Stroke Brother   . Fibroids Sister      Allergies:  Allergies  Allergen Reactions  . Vicodin  [Hydrocodone-Acetaminophen] Nausea And Vomiting and Other (See Comments)    dizziness    Medications:       Current Facility-Administered Medications:  .  ceFAZolin (ANCEF) IVPB 2g/100 mL premix, 2 g, Intravenous, On Call to OR, Florian Buff, MD .  lactated ringers infusion, , Intravenous, Continuous, Lerry Liner, MD, Last Rate: 75 mL/hr at 03/24/17 0822, 1,000 mL at 03/24/17 0822 .  midazolam (VERSED) injection 1-2 mg, 1-2 mg, Intravenous, Q5 min, Lerry Liner, MD, 2 mg at 03/24/17 4081  Review of Systems:   Review of Systems  Constitutional: Negative for fever, chills, weight loss, malaise/fatigue and diaphoresis.  HENT: Negative for hearing loss, ear pain, nosebleeds, congestion, sore throat, neck pain, tinnitus and ear discharge.   Eyes: Negative for blurred vision, double vision, photophobia, pain, discharge and redness.  Respiratory: Negative for cough, hemoptysis, sputum production, shortness of breath, wheezing and stridor.   Cardiovascular: Negative for chest pain, palpitations, orthopnea, claudication, leg swelling and PND.  Gastrointestinal: Positive for abdominal pain. Negative for heartburn, nausea, vomiting, diarrhea, constipation, blood in stool and  melena.  Genitourinary: Negative for dysuria, urgency, frequency, hematuria and flank pain.  Musculoskeletal: Negative for myalgias, back pain, joint pain and falls.  Skin: Negative for itching and rash.  Neurological: Negative for dizziness, tingling, tremors, sensory change, speech change, focal weakness, seizures, loss of consciousness, weakness and headaches.  Endo/Heme/Allergies: Negative for environmental allergies and polydipsia. Does not bruise/bleed easily.  Psychiatric/Behavioral: Negative for depression, suicidal ideas, hallucinations, memory loss and substance abuse. The patient is not nervous/anxious and does not have insomnia.      PHYSICAL EXAM:  Blood pressure 115/77, temperature 97.6 F (36.4 C), resp.  rate 18, SpO2 99 %.    Vitals reviewed. Constitutional: She is oriented to person, place, and time. She appears well-developed and well-nourished.  HENT:  Head: Normocephalic and atraumatic.  Right Ear: External ear normal.  Left Ear: External ear normal.  Nose: Nose normal.  Mouth/Throat: Oropharynx is clear and moist.  Eyes: Conjunctivae and EOM are normal. Pupils are equal, round, and reactive to light. Right eye exhibits no discharge. Left eye exhibits no discharge. No scleral icterus.  Neck: Normal range of motion. Neck supple. No tracheal deviation present. No thyromegaly present.  Cardiovascular: Normal rate, regular rhythm, normal heart sounds and intact distal pulses.  Exam reveals no gallop and no friction rub.   No murmur heard. Respiratory: Effort normal and breath sounds normal. No respiratory distress. She has no wheezes. She has no rales. She exhibits no tenderness.  GI: Soft. Bowel sounds are normal. She exhibits no distension and no mass. There is tenderness. There is no rebound and no guarding.  Genitourinary:       Vulva is normal without lesions Vagina is pink moist without discharge Cervix normal in appearance and pap is normal Uterus is normal size, contour, position, consistency, mobility, non-tender Adnexa is negative with normal sized ovaries by sonogram  Musculoskeletal: Normal range of motion. She exhibits no edema and no tenderness.  Neurological: She is alert and oriented to person, place, and time. She has normal reflexes. She displays normal reflexes. No cranial nerve deficit. She exhibits normal muscle tone. Coordination normal.  Skin: Skin is warm and dry. No rash noted. No erythema. No pallor.  Psychiatric: She has a normal mood and affect. Her behavior is normal. Judgment and thought content normal.    Labs: Results for orders placed or performed during the hospital encounter of 03/24/17 (from the past 336 hour(s))  Glucose, capillary   Collection  Time: 03/24/17  8:11 AM  Result Value Ref Range   Glucose-Capillary 91 65 - 99 mg/dL  Results for orders placed or performed during the hospital encounter of 03/22/17 (from the past 336 hour(s))  CBC   Collection Time: 03/22/17  2:05 PM  Result Value Ref Range   WBC 6.1 4.0 - 10.5 K/uL   RBC 4.25 3.87 - 5.11 MIL/uL   Hemoglobin 9.6 (L) 12.0 - 15.0 g/dL   HCT 30.0 (L) 36.0 - 46.0 %   MCV 70.6 (L) 78.0 - 100.0 fL   MCH 22.6 (L) 26.0 - 34.0 pg   MCHC 32.0 30.0 - 36.0 g/dL   RDW 16.4 (H) 11.5 - 15.5 %   Platelets 334 150 - 400 K/uL  Comprehensive metabolic panel   Collection Time: 03/22/17  2:05 PM  Result Value Ref Range   Sodium 138 135 - 145 mmol/L   Potassium 3.2 (L) 3.5 - 5.1 mmol/L   Chloride 103 101 - 111 mmol/L   CO2 27 22 - 32 mmol/L  Glucose, Bld 103 (H) 65 - 99 mg/dL   BUN 17 6 - 20 mg/dL   Creatinine, Ser 1.06 (H) 0.44 - 1.00 mg/dL   Calcium 9.1 8.9 - 10.3 mg/dL   Total Protein 7.8 6.5 - 8.1 g/dL   Albumin 4.1 3.5 - 5.0 g/dL   AST 43 (H) 15 - 41 U/L   ALT 17 14 - 54 U/L   Alkaline Phosphatase 47 38 - 126 U/L   Total Bilirubin 0.5 0.3 - 1.2 mg/dL   GFR calc non Af Amer >60 >60 mL/min   GFR calc Af Amer >60 >60 mL/min   Anion gap 8 5 - 15  hCG, quantitative, pregnancy   Collection Time: 03/22/17  2:05 PM  Result Value Ref Range   hCG, Beta Chain, Quant, S <1 <5 mIU/mL  Rapid HIV screen (HIV 1/2 Ab+Ag)   Collection Time: 03/22/17  2:05 PM  Result Value Ref Range   HIV-1 P24 Antigen - HIV24 NON REACTIVE NON REACTIVE   HIV 1/2 Antibodies NON REACTIVE NON REACTIVE   Interpretation (HIV Ag Ab)      A non reactive test result means that HIV 1 or HIV 2 antibodies and HIV 1 p24 antigen were not detected in the specimen.  Urinalysis, Routine w reflex microscopic   Collection Time: 03/22/17  2:05 PM  Result Value Ref Range   Color, Urine YELLOW YELLOW   APPearance CLEAR CLEAR   Specific Gravity, Urine 1.027 1.005 - 1.030   pH 5.0 5.0 - 8.0   Glucose, UA NEGATIVE  NEGATIVE mg/dL   Hgb urine dipstick NEGATIVE NEGATIVE   Bilirubin Urine NEGATIVE NEGATIVE   Ketones, ur NEGATIVE NEGATIVE mg/dL   Protein, ur NEGATIVE NEGATIVE mg/dL   Nitrite NEGATIVE NEGATIVE   Leukocytes, UA SMALL (A) NEGATIVE   RBC / HPF 0-5 0 - 5 RBC/hpf   WBC, UA 0-5 0 - 5 WBC/hpf   Bacteria, UA NONE SEEN NONE SEEN   Squamous Epithelial / LPF 0-5 (A) NONE SEEN   Mucus PRESENT     EKG: Orders placed or performed during the hospital encounter of 03/22/17  . EKG 12-Lead  . EKG 12-Lead    Imaging Studies: No results found.    Assessment: 1. Menometrorrhagia N92.1   2. 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 Uterine leiomyoma, unspecified location       Plan: Hysteroscopy uterine curettage Nova Sure endometrial ablation  Rohaan Durnil H 03/24/2017 8:25 AM

## 2017-03-24 NOTE — Discharge Instructions (Signed)
Endometrial Ablation Endometrial ablation is a procedure that destroys the thin inner layer of the lining of the uterus (endometrium). This procedure may be done:  To stop heavy periods.  To stop bleeding that is causing anemia.  To control irregular bleeding.  To treat bleeding caused by small tumors (fibroids) in the endometrium.  This procedure is often an alternative to major surgery, such as removal of the uterus and cervix (hysterectomy). As a result of this procedure:  You may not be able to have children. However, if you are premenopausal (you have not gone through menopause): ? You may still have a small chance of getting pregnant. ? You will need to use a reliable method of birth control after the procedure to prevent pregnancy.  You may stop having a menstrual period, or you may have only a small amount of bleeding during your period. Menstruation may return several years after the procedure.  Tell a health care provider about:  Any allergies you have.  All medicines you are taking, including vitamins, herbs, eye drops, creams, and over-the-counter medicines.  Any problems you or family members have had with the use of anesthetic medicines.  Any blood disorders you have.  Any surgeries you have had.  Any medical conditions you have. What are the risks? Generally, this is a safe procedure. However, problems may occur, including:  A hole (perforation) in the uterus or bowel.  Infection of the uterus, bladder, or vagina.  Bleeding.  Damage to other structures or organs.  An air bubble in the lung (air embolus).  Problems with pregnancy after the procedure.  Failure of the procedure.  Decreased ability to diagnose cancer in the endometrium.  What happens before the procedure?  You will have tests of your endometrium to make sure there are no pre-cancerous cells or cancer cells present.  You may have an ultrasound of the uterus.  You may be given  medicines to thin the endometrium.  Ask your health care provider about: ? Changing or stopping your regular medicines. This is especially important if you take diabetes medicines or blood thinners. ? Taking medicines such as aspirin and ibuprofen. These medicines can thin your blood. Do not take these medicines before your procedure if your doctor tells you not to.  Plan to have someone take you home from the hospital or clinic. What happens during the procedure?  You will lie on an exam table with your feet and legs supported as in a pelvic exam.  To lower your risk of infection: ? Your health care team will wash or sanitize their hands and put on germ-free (sterile) gloves. ? Your genital area will be washed with soap.  An IV tube will be inserted into one of your veins.  You will be given a medicine to help you relax (sedative).  A surgical instrument with a light and camera (resectoscope) will be inserted into your vagina and moved into your uterus. This allows your surgeon to see inside your uterus.  Endometrial tissue will be removed using one of the following methods: ? Radiofrequency. This method uses a radiofrequency-alternating electric current to remove the endometrium. ? Cryotherapy. This method uses extreme cold to freeze the endometrium. ? Heated-free liquid. This method uses a heated saltwater (saline) solution to remove the endometrium. ? Microwave. This method uses high-energy microwaves to heat up the endometrium and remove it. ? Thermal balloon. This method involves inserting a catheter with a balloon tip into the uterus. The balloon tip is   filled with heated fluid to remove the endometrium. The procedure may vary among health care providers and hospitals. What happens after the procedure?  Your blood pressure, heart rate, breathing rate, and blood oxygen level will be monitored until the medicines you were given have worn off.  As tissue healing occurs, you may  notice vaginal bleeding for 4-6 weeks after the procedure. You may also experience: ? Cramps. ? Thin, watery vaginal discharge that is light pink or brown in color. ? A need to urinate more frequently than usual. ? Nausea.  Do not drive for 24 hours if you were given a sedative.  Do not have sex or insert anything into your vagina until your health care provider approves. Summary  Endometrial ablation is done to treat the many causes of heavy menstrual bleeding.  The procedure may be done only after medications have been tried to control the bleeding.  Plan to have someone take you home from the hospital or clinic. This information is not intended to replace advice given to you by your health care provider. Make sure you discuss any questions you have with your health care provider. Document Released: 05/08/2004 Document Revised: 07/16/2016 Document Reviewed: 07/16/2016 Elsevier Interactive Patient Education  2017 [employer]  

## 2017-03-24 NOTE — Op Note (Signed)
Preoperative diagnosis: Menometrorrhagia                                        Dysmenorrhea                                        Anemia, acute on chronic   Postoperative diagnoses: Same as above   Procedure: Hysteroscopy, uterine curettage, endometrial ablation using Novasure  Surgeon: Florian Buff   Anesthesia: Laryngeal mask airway  Findings: The endometrium was normal.  There were no fibroid or other abnormalities.  Description of operation: The patient was taken to the operating room and placed in the supine position. She underwent general anesthesia using the laryngeal mask airway. She was placed in the dorsal lithotomy position and prepped and draped in the usual sterile fashion. A Graves speculum was placed and the anterior cervical lip was grasped with a single-tooth tenaculum. The cervix was dilated serially to allow passage of the hysteroscope. Diagnostic hysteroscopy was performed and was found to be normal. A vigorous uterine curettage was then performed and all tissue sent to pathology for evaluation.  I then proceeded to perform the Novasure endometrial ablation.  The cervical length was 3.0. The uterus sounded to  9.5 cm yielding a net length of 6.5 cm.  The endometrial cavity was 5.0 cm wide. The power was 179 watts.  The total time of therapy was 1 min 02 seconds. The array was evaluated after the procedure and tissue was adherent on all the dimensions of the surface, confirming fundal treatment as well.    All of the equipment worked well throughout the procedure.  The patient was awakened from anesthesia and taken to the recovery room in good stable condition all counts were correct. She received 2 g of Ancef and 30 mg of Toradol preoperatively. She will be discharged from the recovery room and followed up in the office in 1- 2 weeks.  EURE,LUTHER H 03/24/2017 9:26 AM

## 2017-03-24 NOTE — Anesthesia Postprocedure Evaluation (Signed)
Anesthesia Post Note  Patient: Chloe Marks  Procedure(s) Performed: Procedure(s) (LRB): DILATATION & CURETTAGE/HYSTEROSCOPY WITH NOVASURE ABLATION (N/A)  Patient location during evaluation: PACU Anesthesia Type: General Level of consciousness: awake and alert, oriented and patient cooperative Pain management: pain level controlled Vital Signs Assessment: post-procedure vital signs reviewed and stable Respiratory status: spontaneous breathing Cardiovascular status: stable Postop Assessment: no signs of nausea or vomiting Anesthetic complications: no     Last Vitals:  Vitals:   03/24/17 0845 03/24/17 0930  BP: 113/77 128/89  Pulse:  77  Resp: (!) 31 10  Temp:  37.1 C  SpO2: 99% 100%    Last Pain:  Vitals:   03/24/17 0800  PainSc: 4                  Lumina Gitto A

## 2017-03-24 NOTE — Anesthesia Procedure Notes (Signed)
Procedure Name: LMA Insertion Date/Time: 03/24/2017 8:59 AM Performed by: Andree Elk, AMY A Pre-anesthesia Checklist: Patient identified, Timeout performed, Emergency Drugs available, Patient being monitored and Suction available Patient Re-evaluated:Patient Re-evaluated prior to induction Oxygen Delivery Method: Circle system utilized Preoxygenation: Pre-oxygenation with 100% oxygen Induction Type: IV induction Ventilation: Mask ventilation without difficulty LMA: LMA inserted LMA Size: 4.0 Number of attempts: 1 Placement Confirmation: positive ETCO2 Tube secured with: Tape Dental Injury: Teeth and Oropharynx as per pre-operative assessment

## 2017-03-24 NOTE — Transfer of Care (Signed)
Immediate Anesthesia Transfer of Care Note  Patient: Chloe Marks  Procedure(s) Performed: Procedure(s): DILATATION & CURETTAGE/HYSTEROSCOPY WITH NOVASURE ABLATION (N/A)  Patient Location: PACU  Anesthesia Type:General  Level of Consciousness: awake, oriented and patient cooperative  Airway & Oxygen Therapy: Patient Spontanous Breathing and Patient connected to nasal cannula oxygen  Post-op Assessment: Report given to RN and Post -op Vital signs reviewed and stable  Post vital signs: Reviewed and stable  Last Vitals:  Vitals:   03/24/17 0840 03/24/17 0845  BP: 109/77 113/77  Resp: (!) 50 (!) 31  Temp:    SpO2: 98% 99%    Last Pain:  Vitals:   03/24/17 0800  PainSc: 4          Complications: No apparent anesthesia complications

## 2017-03-24 NOTE — Anesthesia Preprocedure Evaluation (Signed)
Anesthesia Evaluation  Patient identified by MRN, date of birth, ID band Patient awake    Reviewed: Allergy & Precautions, NPO status , Patient's Chart, lab work & pertinent test results  Airway Mallampati: II  TM Distance: >3 FB Neck ROM: Full    Dental  (+) Teeth Intact, Chipped,    Pulmonary former smoker,    breath sounds clear to auscultation       Cardiovascular hypertension, Pt. on medications  Rhythm:Regular Rate:Normal     Neuro/Psych CVA, No Residual Symptoms    GI/Hepatic neg GERD  ,  Endo/Other  diabetes (Pre)  Renal/GU      Musculoskeletal   Abdominal   Peds  Hematology  (+) anemia ,   Anesthesia Other Findings   Reproductive/Obstetrics                             Anesthesia Physical Anesthesia Plan  ASA: III  Anesthesia Plan: General   Post-op Pain Management:    Induction: Intravenous  PONV Risk Score and Plan:   Airway Management Planned: LMA  Additional Equipment:   Intra-op Plan:   Post-operative Plan: Extubation in OR  Informed Consent: I have reviewed the patients History and Physical, chart, labs and discussed the procedure including the risks, benefits and alternatives for the proposed anesthesia with the patient or authorized representative who has indicated his/her understanding and acceptance.     Plan Discussed with:   Anesthesia Plan Comments:         Anesthesia Quick Evaluation

## 2017-03-25 ENCOUNTER — Encounter (HOSPITAL_COMMUNITY): Payer: Self-pay | Admitting: Obstetrics & Gynecology

## 2017-03-26 ENCOUNTER — Telehealth: Payer: Self-pay | Admitting: Obstetrics & Gynecology

## 2017-03-26 ENCOUNTER — Telehealth: Payer: Self-pay | Admitting: *Deleted

## 2017-03-26 NOTE — Telephone Encounter (Signed)
Pt called requesting a note to return to work on Monday. Informed pt that a note would be here at the front desk for her to pick up.

## 2017-03-26 NOTE — Telephone Encounter (Signed)
Pt needed work note to include date of surgery

## 2017-03-26 NOTE — Telephone Encounter (Signed)
Patient called stating that she had surgery with Dr. Elonda Husky on 9/12 and she is needing a return to work note. Please contact pt

## 2017-04-01 ENCOUNTER — Encounter: Payer: Self-pay | Admitting: Obstetrics & Gynecology

## 2017-04-01 ENCOUNTER — Ambulatory Visit (INDEPENDENT_AMBULATORY_CARE_PROVIDER_SITE_OTHER): Payer: Commercial Managed Care - PPO | Admitting: Obstetrics & Gynecology

## 2017-04-01 VITALS — BP 118/76 | HR 69 | Ht 67.0 in | Wt 201.0 lb

## 2017-04-01 DIAGNOSIS — Z9889 Other specified postprocedural states: Secondary | ICD-10-CM

## 2017-04-01 MED ORDER — KETOROLAC TROMETHAMINE 10 MG PO TABS
10.0000 mg | ORAL_TABLET | Freq: Three times a day (TID) | ORAL | 0 refills | Status: DC | PRN
Start: 2017-04-01 — End: 2017-12-01

## 2017-04-01 NOTE — Progress Notes (Signed)
  HPI: Patient returns for routine postoperative follow-up having undergone hysteroscopy uterine curettage endometrial ablation  on 03/24/2017.  The patient's immediate postoperative recovery has been unremarkable. Since hospital discharge the patient reports passing tissue and significant bleeding.   Current Outpatient Prescriptions: aspirin EC 81 MG tablet, Take 81 mg by mouth daily., Disp: , Rfl:  Cranberry 500 MG CHEW, Chew by mouth 2 (two) times daily., Disp: , Rfl:  docusate sodium (COLACE) 100 MG capsule, Take 100 mg by mouth daily., Disp: , Rfl:  ibuprofen (ADVIL,MOTRIN) 800 MG tablet, Take 800 mg by mouth every 8 (eight) hours as needed., Disp: , Rfl:  iron polysaccharides (NIFEREX) 150 MG capsule, Take 150 mg by mouth daily., Disp: , Rfl:  ketorolac (TORADOL) 10 MG tablet, Take 1 tablet (10 mg total) by mouth every 8 (eight) hours as needed., Disp: 15 tablet, Rfl: 0 lisinopril-hydrochlorothiazide (PRINZIDE,ZESTORETIC) 10-12.5 MG per tablet, Take 1 tablet by mouth daily.  , Disp: , Rfl:  ondansetron (ZOFRAN ODT) 8 MG disintegrating tablet, Take 1 tablet (8 mg total) by mouth every 8 (eight) hours as needed for nausea or vomiting., Disp: 20 tablet, Rfl: 0 oxyCODONE-acetaminophen (ROXICET) 5-325 MG tablet, Take 1-2 tablets by mouth every 4 (four) hours as needed for severe pain., Disp: 12 tablet, Rfl: 0  No current facility-administered medications for this visit.     Blood pressure 118/76, pulse 69, height 5\' 7"  (1.702 m), weight 201 lb (91.2 kg).  Physical Exam: Blood in vagina and some tissue Uterus is non tender, exam normal  Diagnostic Tests:   Pathology: bnign secretory endometrium with exogenous effect  Impression: S/p hysteroscopy uterine curettage endometrial ablation with significant post operative bleeding  Plan: Routine post op care  Follow up: 1  years  Florian Buff, MD

## 2017-04-30 ENCOUNTER — Telehealth: Payer: Self-pay | Admitting: *Deleted

## 2017-04-30 ENCOUNTER — Other Ambulatory Visit: Payer: Self-pay | Admitting: Obstetrics & Gynecology

## 2017-04-30 MED ORDER — FLUCONAZOLE 150 MG PO TABS: 150.0000 mg | ORAL_TABLET | Freq: Once | ORAL | 0 refills | Status: AC

## 2017-04-30 NOTE — Telephone Encounter (Signed)
Diflucan prescribed

## 2017-04-30 NOTE — Telephone Encounter (Signed)
LMOVM that Diflucan was sent to [facility] and not [place] but to call if it needed to be changed.

## 2017-04-30 NOTE — Telephone Encounter (Signed)
Pt had surgery 03-24-17. Had endo ablation and D&C. Pt thinks she has yeast infection. Wants to see if can get RX sent to [facility]? Please advise.

## 2017-05-31 ENCOUNTER — Telehealth: Payer: Self-pay | Admitting: Obstetrics & Gynecology

## 2017-05-31 NOTE — Telephone Encounter (Signed)
Patient called stating that she had surgery back in sept and everything was going good until this month. Pt states that she has the problem they tried to fix reoccur and she would like Dr. Elonda Husky to place her back on megace. Please contact pt

## 2017-05-31 NOTE — Telephone Encounter (Signed)
Patient called stating she is bleeding "like a river" which started yesterday. Spotting started at the end of October but just got heavy. Knows she could still have bleeding after ablation but states bleeding is worse than before she had her ablation. Wants to know if she can take Megace again and if she should stop taking aspirin because the bleeding is so heavy. Please advise.

## 2017-05-31 NOTE — Telephone Encounter (Signed)
Stop the aspirin, and yes take a trial of megestrol again  This is unusual if it continues into next week I need to come in for a sonogram and exam

## 2017-06-01 NOTE — Telephone Encounter (Signed)
Informed patient that per Dr Elonda Husky she needed to stop her Aspirin and to do a trial of Megace. Patient states she is still bleeding. Advised if bleeding continues to next week, To give [place] a call to get her in for a sonogram and exam with Dr Elonda Husky. Pt verbalized understanding.

## 2017-06-23 ENCOUNTER — Other Ambulatory Visit: Payer: Self-pay | Admitting: Obstetrics & Gynecology

## 2017-06-23 DIAGNOSIS — N920 Excessive and frequent menstruation with regular cycle: Secondary | ICD-10-CM

## 2017-06-24 ENCOUNTER — Ambulatory Visit (INDEPENDENT_AMBULATORY_CARE_PROVIDER_SITE_OTHER): Payer: Commercial Managed Care - PPO

## 2017-06-24 ENCOUNTER — Ambulatory Visit (INDEPENDENT_AMBULATORY_CARE_PROVIDER_SITE_OTHER): Payer: Commercial Managed Care - PPO | Admitting: Obstetrics & Gynecology

## 2017-06-24 VITALS — BP 112/74 | HR 80 | Ht 67.0 in | Wt 200.5 lb

## 2017-06-24 DIAGNOSIS — D500 Iron deficiency anemia secondary to blood loss (chronic): Secondary | ICD-10-CM | POA: Diagnosis not present

## 2017-06-24 DIAGNOSIS — N921 Excessive and frequent menstruation with irregular cycle: Secondary | ICD-10-CM | POA: Diagnosis not present

## 2017-06-24 DIAGNOSIS — N920 Excessive and frequent menstruation with regular cycle: Secondary | ICD-10-CM

## 2017-06-24 DIAGNOSIS — D259 Leiomyoma of uterus, unspecified: Secondary | ICD-10-CM

## 2017-06-24 MED ORDER — MEGESTROL ACETATE 40 MG PO TABS: ORAL_TABLET | ORAL | 3 refills | Status: DC

## 2017-06-24 NOTE — Progress Notes (Signed)
PELVIC US TA/TV: heterogeneous anteverted uterus w/mult fibroids,largest fibroids (#1)fundal intramural 2.1 x 1.6 x 2 cm,(#2) ant subserosal fibroid 3 x 2.6 x 3.2 cm,(#3) subserosal posterior fibroid 2.6 x 2.2 x 2.7 cm,EEC 13 mm,normal ovaries bilat,ovaries appear mobile,no free fluid,right adnexal pain during ultrasound

## 2017-06-24 NOTE — Progress Notes (Signed)
Follow up appointment for results  Chief Complaint  Patient presents with  . Follow-up    Ultrasound today    Blood pressure 112/74, pulse 80, height 5\' 7"  (1.702 m), weight 200 lb 8 oz (90.9 kg), last menstrual period 06/01/2017.  GYNECOLOGIC SONOGRAM   Chloe Marks is a 41 y.o. [id number] LMP 05/29/2017,she is here for a pelvic sonogram for menorrhagia with RLQ pain.  Uterus                      12.3 x 5 x 6.7 cm, vol 215 ml, enlarged heterogeneous anteverted uterus w/mult fibroids  Endometrium          13 mm, symmetrical, wnl  Right ovary             2.1 x 1.4 x 2.3 cm, wnl  Left ovary                2 x 1.3 x 2.1 cm, wnl  Largest fibroids (#1)fundal intramural 2.1 x 1.6 x 2 cm,(#2) anterior subserosal fibroid 3 x 2.6 x 3.2 cm,(#3) subserosal posterior fibroid 2.6 x 2.2 x 2.7 cm  Technician Comments:  PELVIC US TA/TV: heterogeneous anteverted uterus w/mult fibroids,largest fibroids (#1)fundal intramural 2.1 x 1.6 x 2 cm,(#2) ant subserosal fibroid 3 x 2.6 x 3.2 cm,(#3) subserosal posterior fibroid 2.6 x 2.2 x 2.7 cm,EEC 13 mm,normal ovaries bilat,ovaries appear mobile,no free fluid,right adnexal pain during ultrasound    [employer] 06/24/2017 1:44 PM  Clinical Impression and recommendations:  I have reviewed the sonogram results above, combined with the patient's current clinical course, below are my impressions and any appropriate recommendations for management based on the sonographic findings.  Mildly enlarged uterus with 3 small myomas Endometrium is normal Both ovaries are normal   Florian Buff      MEDS ordered this encounter: Meds ordered this encounter  Medications  . megestrol (MEGACE) 40 MG tablet    Sig: 3 tablets a day for 5 days, 2 tablets a day for 5 days then 1 tablet daily    Dispense:  45 tablet    Refill:  3    Orders for this encounter: No orders of the defined types were placed in this  encounter.   Impression: Menometrorrhagia  Anemia due to chronic blood loss     Plan: Megestrol therapy s/p ablation No hematometra  Follow Up: Return if symptoms worsen or fail to improve.       Face to face time:  10 minutes  Greater than 50% of the visit time was spent in counseling and coordination of care with the patient.  The summary and outline of the counseling and care coordination is summarized in the note above.   All questions were answered.  Past Medical History:  Diagnosis Date  . Anemia   . Family history of adverse reaction to anesthesia    PONV  . Fibroids   . Heart murmur   . Hypertension   . Ovarian cyst   . Pre-diabetes   . 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 CHOLECYSTECTOMY    . DILITATION & CURRETTAGE/HYSTROSCOPY WITH NOVASURE ABLATION N/A 03/24/2017   Procedure: DILATATION & CURETTAGE/HYSTEROSCOPY WITH NOVASURE ABLATION;  Surgeon: Florian Buff, MD;  Location: [facility];  Service: Gynecology;  Laterality: N/A;  . TUBAL LIGATION  OB History    Gravida  3   Para  2   Term  2   Preterm      AB  1   Living  2     SAB  1   TAB      Ectopic      Multiple      Live Births  2           Allergies  Allergen Reactions  . Vicodin [Hydrocodone-Acetaminophen] Nausea And Vomiting and Other (See Comments)    dizziness    Social History   Socioeconomic History  . Marital status: Married    Spouse name: Not on file  . Number of children: Not on file  . Years of education: Not on file  . Highest education level: Not on file  Occupational History  . Not on file  Social Needs  . Financial resource strain: Not on file  . Food insecurity:    Worry: Not on file    Inability: Not on file  . Transportation needs:    Medical: Not on file    Non-medical: Not on file  Tobacco Use  . Smoking status: Former  Smoker    Packs/day: 0.25    Years: 4.00    Pack years: 1.00    Types: Cigarettes, Cigars    Last attempt to quit: 03/22/2014    Years since quitting: 3.6  . Smokeless tobacco: Never Used  Substance and Sexual Activity  . Alcohol use: No  . Drug use: No  . Sexual activity: Not Currently    Birth control/protection: Surgical    Comment: tubal  Lifestyle  . Physical activity:    Days per week: Not on file    Minutes per session: Not on file  . Stress: Not on file  Relationships  . Social connections:    Talks on phone: Not on file    Gets together: Not on file    Attends religious service: Not on file    Active member of club or organization: Not on file    Attends meetings of clubs or organizations: Not on file    Relationship status: Not on file  Other Topics Concern  . Not on file  Social History Narrative  . Not on file    Family History  Problem Relation Age of Onset  . Hypertension Mother   . Non-Hodgkin's lymphoma Sister   . Other Paternal Grandmother        heart issues  . Hypertension Brother   . Diabetes Brother   . Stroke Brother   . Fibroids Sister

## 2017-07-13 ENCOUNTER — Encounter (HOSPITAL_COMMUNITY): Payer: Self-pay | Admitting: *Deleted

## 2017-07-13 ENCOUNTER — Other Ambulatory Visit: Payer: Self-pay

## 2017-07-13 ENCOUNTER — Emergency Department (HOSPITAL_COMMUNITY)
Admission: EM | Admit: 2017-07-13 | Discharge: 2017-07-13 | Disposition: A | Discharge status: Home / Self Care | Payer: Commercial Managed Care - PPO | Attending: Emergency Medicine | Admitting: Emergency Medicine

## 2017-07-13 DIAGNOSIS — Z8673 Personal history of transient ischemic attack (TIA), and cerebral infarction without residual deficits: Secondary | ICD-10-CM | POA: Diagnosis not present

## 2017-07-13 DIAGNOSIS — Z87891 Personal history of nicotine dependence: Secondary | ICD-10-CM | POA: Insufficient documentation

## 2017-07-13 DIAGNOSIS — Z7982 Long term (current) use of aspirin: Secondary | ICD-10-CM | POA: Diagnosis not present

## 2017-07-13 DIAGNOSIS — I10 Essential (primary) hypertension: Secondary | ICD-10-CM | POA: Diagnosis not present

## 2017-07-13 DIAGNOSIS — J029 Acute pharyngitis, unspecified: Secondary | ICD-10-CM | POA: Diagnosis not present

## 2017-07-13 DIAGNOSIS — B9789 Other viral agents as the cause of diseases classified elsewhere: Secondary | ICD-10-CM | POA: Insufficient documentation

## 2017-07-13 DIAGNOSIS — Z79899 Other long term (current) drug therapy: Secondary | ICD-10-CM | POA: Diagnosis not present

## 2017-07-13 DIAGNOSIS — R070 Pain in throat: Secondary | ICD-10-CM | POA: Diagnosis present

## 2017-07-13 LAB — RAPID STREP SCREEN (MED CTR MEBANE ONLY): Streptococcus, Group A Screen (Direct): NEGATIVE

## 2017-07-13 MED ORDER — FLUTICASONE PROPIONATE 50 MCG/ACT NA SUSP: 2.0000 | Freq: Every day | NASAL | 0 refills | Status: DC

## 2017-07-13 MED ORDER — DM-GUAIFENESIN ER 30-600 MG PO TB12: 1.0000 | ORAL_TABLET | Freq: Two times a day (BID) | ORAL | 0 refills | Status: DC

## 2017-07-13 NOTE — Discharge Instructions (Signed)
Continue Ibuprofen for pain or fever Continue Zyrtec for congestion Use Flonase as well for nasal congestion and sinus pressure Take Mucinex DM for cough Drink plenty of fluids Please follow up with your doctor Return if worsening

## 2017-07-13 NOTE — ED Triage Notes (Signed)
Pt c/o sore throat x 3 days; pt states she has had some head congestion

## 2017-07-13 NOTE — ED Provider Notes (Signed)
[facility] EMERGENCY DEPARTMENT Provider Note   CSN: [id number] Arrival date & time: 07/13/17  1932     History   Chief Complaint Chief Complaint  Patient presents with  . Sore Throat    HPI Chloe Marks is a 42 y.o. female who presents with a sore throat. PMH significant for history of sinus infections and bronchitis.  The patient states that for the past 3 days she has had a sore throat and sinus pressure and a mild cough.  She has not had any fever, ear pain, headache, runny nose, chest pain, shortness of breath, wheezing.  She works in [employer] and has had multiple sick contacts.  She has been taking ibuprofen with good relief of pain and pressure throat.  No difficulty swallowing or inability to swallow.  HPI  Past Medical History:  Diagnosis Date  . Anemia   . Family history of adverse reaction to anesthesia    PONV  . Fibroids   . Heart murmur   . Hypertension   . Ovarian cyst   . Pre-diabetes   . Stroke [facility]) august 2011   after gall bladder surgery; has lesion of left side of brain    Patient Active Problem List   Diagnosis Date Noted  . Closed head injury without loss of consciousness 06/11/2011    Past Surgical History:  Procedure Laterality Date  . CHOLECYSTECTOMY    . DILITATION & CURRETTAGE/HYSTROSCOPY WITH NOVASURE ABLATION N/A 03/24/2017   Procedure: DILATATION & CURETTAGE/HYSTEROSCOPY WITH NOVASURE ABLATION;  Surgeon: Florian Buff, MD;  Location: [facility];  Service: Gynecology;  Laterality: N/A;  . TUBAL LIGATION      OB History    Gravida Para Term Preterm AB Living   3 2 2   1 2    SAB TAB Ectopic Multiple Live Births   1       2       Home Medications    Prior to Admission medications   Medication Sig Start Date End Date Taking? Authorizing Provider  aspirin EC 81 MG tablet Take 81 mg by mouth daily.    [provider]  Cranberry 500 MG CHEW Chew by mouth 2 (two) times daily.    [provider]  docusate sodium  (COLACE) 100 MG capsule Take 100 mg by mouth daily.    [provider]  ibuprofen (ADVIL,MOTRIN) 800 MG tablet Take 800 mg by mouth every 8 (eight) hours as needed.    [provider]  iron polysaccharides (NIFEREX) 150 MG capsule Take 150 mg by mouth daily.    [provider]  ketorolac (TORADOL) 10 MG tablet Take 1 tablet (10 mg total) by mouth every 8 (eight) hours as needed. 04/01/17   Florian Buff, MD  lisinopril-hydrochlorothiazide (PRINZIDE,ZESTORETIC) 10-12.5 MG per tablet Take 1 tablet by mouth daily.      [provider]  megestrol (MEGACE) 40 MG tablet Take 40 mg by mouth daily.    [provider]  megestrol (MEGACE) 40 MG tablet 3 tablets a day for 5 days, 2 tablets a day for 5 days then 1 tablet daily 06/24/17   Florian Buff, MD  ondansetron (ZOFRAN ODT) 8 MG disintegrating tablet Take 1 tablet (8 mg total) by mouth every 8 (eight) hours as needed for nausea or vomiting. 03/24/17   Florian Buff, MD  oxyCODONE-acetaminophen (ROXICET) 5-325 MG tablet Take 1-2 tablets by mouth every 4 (four) hours as needed for severe pain. Patient not taking:  Reported on 06/24/2017 03/24/17   Florian Buff, MD    Family History Family History  Problem Relation Age of Onset  . Hypertension Mother   . Non-Hodgkin's lymphoma Sister   . Other Paternal Grandmother        heart issues  . Hypertension Brother   . Diabetes Brother   . Stroke Brother   . Fibroids Sister     Social History Social History   Tobacco Use  . Smoking status: Former Smoker    Packs/day: 0.25    Years: 4.00    Pack years: 1.00    Types: Cigarettes, Cigars    Last attempt to quit: 03/22/2014    Years since quitting: 3.3  . Smokeless tobacco: Never Used  Substance Use Topics  . Alcohol use: No  . Drug use: No     Allergies   Vicodin [hydrocodone-acetaminophen]   Review of Systems Review of Systems  Constitutional: Negative for chills and fever.  HENT: Positive  for congestion, sinus pressure and sore throat. Negative for ear pain and rhinorrhea.   Respiratory: Positive for cough. Negative for shortness of breath and wheezing.   Cardiovascular: Negative for chest pain.     Physical Exam Updated Vital Signs BP (!) 156/94 (BP Location: Right Arm)   Pulse 86   Temp 98.6 F (37 C) (Oral)   Resp 16   Ht 5\' 7"  (1.702 m)   Wt 90.7 kg (200 lb)   LMP 06/01/2017   SpO2 100%   BMI 31.32 kg/m   Physical Exam  Constitutional: She is oriented to person, place, and time. She appears well-developed and well-nourished. No distress.  Well-appearing  HENT:  Head: Normocephalic and atraumatic.  Right Ear: Hearing, tympanic membrane, external ear and ear canal normal.  Left Ear: Hearing, tympanic membrane, external ear and ear canal normal.  Nose: Right sinus exhibits maxillary sinus tenderness. Left sinus exhibits maxillary sinus tenderness.  Mouth/Throat: Uvula is midline, oropharynx is clear and moist and mucous membranes are normal.  Eyes: Conjunctivae are normal. Pupils are equal, round, and reactive to light. Right eye exhibits no discharge. Left eye exhibits no discharge. No scleral icterus.  Neck: Normal range of motion.  Cardiovascular: Normal rate and regular rhythm. Exam reveals no gallop and no friction rub.  No murmur heard. Pulmonary/Chest: Effort normal and breath sounds normal. No stridor. No respiratory distress. She has no wheezes. She has no rales. She exhibits no tenderness.  Abdominal: She exhibits no distension.  Neurological: She is alert and oriented to person, place, and time.  Skin: Skin is warm and dry.  Psychiatric: She has a normal mood and affect. Her behavior is normal.  Nursing note and vitals reviewed.    ED Treatments / Results  Labs (all labs ordered are listed, but only abnormal results are displayed) Labs Reviewed  RAPID STREP SCREEN (NOT AT [facility])  CULTURE, GROUP A STREP [facility])    EKG  EKG  Interpretation None       Radiology No results found.  Procedures Procedures (including critical care time)  Medications Ordered in ED Medications - No data to display   Initial Impression / Assessment and Plan / ED Course  I have reviewed the triage vital signs and the nursing notes.  Pertinent labs & imaging results that were available during my care of the patient were reviewed by me and considered in my medical decision making (see chart for details).  42 year old female with viral upper respiratory infection.  She is  mildly hypertensive otherwise vital signs are normal.  Exam is overall unremarkable.  She is very well-appearing.  Rapid strep is negative.  Advised to continue over-the-counter medicines and will add Mucinex DM and Flonase for congestion.  Advised follow-up with her doctor or return if worsening.  A work note was provided  Final Clinical Impressions(s) / ED Diagnoses   Final diagnoses:  Viral pharyngitis    ED Discharge Orders    None       Iris Pert 07/13/17 2156    Milton Ferguson, MD 07/13/17 2157

## 2017-07-16 LAB — CULTURE, GROUP A STREP (THRC)

## 2017-11-16 ENCOUNTER — Ambulatory Visit: Payer: Commercial Managed Care - PPO | Admitting: Obstetrics & Gynecology

## 2017-11-16 ENCOUNTER — Encounter: Payer: Self-pay | Admitting: Obstetrics & Gynecology

## 2017-11-16 VITALS — BP 118/66 | HR 88 | Ht 67.0 in | Wt 211.0 lb

## 2017-11-16 DIAGNOSIS — R1031 Right lower quadrant pain: Secondary | ICD-10-CM

## 2017-11-16 DIAGNOSIS — G8929 Other chronic pain: Secondary | ICD-10-CM | POA: Diagnosis not present

## 2017-11-16 DIAGNOSIS — N921 Excessive and frequent menstruation with irregular cycle: Secondary | ICD-10-CM | POA: Diagnosis not present

## 2017-11-16 DIAGNOSIS — D500 Iron deficiency anemia secondary to blood loss (chronic): Secondary | ICD-10-CM

## 2017-11-16 DIAGNOSIS — D251 Intramural leiomyoma of uterus: Secondary | ICD-10-CM | POA: Diagnosis not present

## 2017-11-16 DIAGNOSIS — D252 Subserosal leiomyoma of uterus: Secondary | ICD-10-CM

## 2017-11-16 DIAGNOSIS — D259 Leiomyoma of uterus, unspecified: Secondary | ICD-10-CM

## 2017-11-16 NOTE — Progress Notes (Signed)
Preoperative History and Physical  Chloe Marks is a 42 y.o. [id number] with No LMP recorded. admitted for abdominal supracervical hysterectomy with removal of right tube and ovary and left tube, preservation of left ovary.  Patient has been on megestrol now for about 5 months to control her menses which has helped significantly but not perfectly She has a history of severe pain and heavy periods with clots soils her clothes and sheets.  Sonogram reveals small myomas.  Additionally she has chronic right lower quadrant, consistently most days, not necessarily associated with the bleeding No dyspareunia  PMH:    Past Medical History:  Diagnosis Date  . Anemia   . Family history of adverse reaction to anesthesia    PONV  . Fibroids   . Heart murmur   . Hypertension   . Ovarian cyst   . Pre-diabetes   . Stroke [facility]) august 2011   after gall bladder surgery; has lesion of left side of brain    PSH:     Past Surgical History:  Procedure Laterality Date  . CHOLECYSTECTOMY    . DILITATION & CURRETTAGE/HYSTROSCOPY WITH NOVASURE ABLATION N/A 03/24/2017   Procedure: DILATATION & CURETTAGE/HYSTEROSCOPY WITH NOVASURE ABLATION;  Surgeon: Florian Buff, MD;  Location: [facility];  Service: Gynecology;  Laterality: N/A;  . TUBAL LIGATION      POb/GynH:      OB History    Gravida  3   Para  2   Term  2   Preterm      AB  1   Living  2     SAB  1   TAB      Ectopic      Multiple      Live Births  2           SH:   Social History   Tobacco Use  . Smoking status: Former Smoker    Packs/day: 0.25    Years: 4.00    Pack years: 1.00    Types: Cigarettes, Cigars    Last attempt to quit: 03/22/2014    Years since quitting: 3.6  . Smokeless tobacco: Never Used  Substance Use Topics  . Alcohol use: No  . Drug use: No    FH:    Family History  Problem Relation Age of Onset  . Hypertension Mother   . Non-Hodgkin's lymphoma Sister   . Other Paternal Grandmother       heart issues  . Hypertension Brother   . Diabetes Brother   . Stroke Brother   . Fibroids Sister      Allergies:  Allergies  Allergen Reactions  . Vicodin [Hydrocodone-Acetaminophen] Nausea And Vomiting and Other (See Comments)    dizziness    Medications:       Current Outpatient Medications:  .  aspirin EC 81 MG tablet, Take 81 mg by mouth daily., Disp: , Rfl:  .  docusate sodium (COLACE) 100 MG capsule, Take 100 mg by mouth daily., Disp: , Rfl:  .  escitalopram (LEXAPRO) 10 MG tablet, Take 10 mg by mouth daily., Disp: , Rfl:  .  fluticasone (FLONASE) 50 MCG/ACT nasal spray, Place 2 sprays into both nostrils daily., Disp: 9.9 g, Rfl: 0 .  ibuprofen (ADVIL,MOTRIN) 800 MG tablet, Take 800 mg by mouth every 8 (eight) hours as needed., Disp: , Rfl:  .  iron polysaccharides (NIFEREX) 150 MG capsule, Take 150 mg by mouth daily., Disp: , Rfl:  .  ketorolac (TORADOL) 10  MG tablet, Take 1 tablet (10 mg total) by mouth every 8 (eight) hours as needed., Disp: 15 tablet, Rfl: 0 .  lisinopril-hydrochlorothiazide (PRINZIDE,ZESTORETIC) 10-12.5 MG per tablet, Take 1 tablet by mouth daily.  , Disp: , Rfl:  .  megestrol (MEGACE) 40 MG tablet, Take 40 mg by mouth daily., Disp: , Rfl:  .  ondansetron (ZOFRAN ODT) 8 MG disintegrating tablet, Take 1 tablet (8 mg total) by mouth every 8 (eight) hours as needed for nausea or vomiting., Disp: 20 tablet, Rfl: 0 .  Cranberry 500 MG CHEW, Chew by mouth 2 (two) times daily., Disp: , Rfl:  .  dextromethorphan-guaiFENesin (MUCINEX DM) 30-600 MG 12hr tablet, Take 1 tablet by mouth 2 (two) times daily. (Patient not taking: Reported on 11/16/2017), Disp: 10 tablet, Rfl: 0 .  megestrol (MEGACE) 40 MG tablet, 3 tablets a day for 5 days, 2 tablets a day for 5 days then 1 tablet daily, Disp: 45 tablet, Rfl: 3  Review of Systems:   Review of Systems  Constitutional: Negative for fever, chills, weight loss, malaise/fatigue and diaphoresis.  HENT: Negative for hearing  loss, ear pain, nosebleeds, congestion, sore throat, neck pain, tinnitus and ear discharge.   Eyes: Negative for blurred vision, double vision, photophobia, pain, discharge and redness.  Respiratory: Negative for cough, hemoptysis, sputum production, shortness of breath, wheezing and stridor.   Cardiovascular: Negative for chest pain, palpitations, orthopnea, claudication, leg swelling and PND.  Gastrointestinal: Positive for abdominal pain. Negative for heartburn, nausea, vomiting, diarrhea, constipation, blood in stool and melena.  Genitourinary: Negative for dysuria, urgency, frequency, hematuria and flank pain.  Musculoskeletal: Negative for myalgias, back pain, joint pain and falls.  Skin: Negative for itching and rash.  Neurological: Negative for dizziness, tingling, tremors, sensory change, speech change, focal weakness, seizures, loss of consciousness, weakness and headaches.  Endo/Heme/Allergies: Negative for environmental allergies and polydipsia. Does not bruise/bleed easily.  Psychiatric/Behavioral: Negative for depression, suicidal ideas, hallucinations, memory loss and substance abuse. The patient is not nervous/anxious and does not have insomnia.      PHYSICAL EXAM:  Blood pressure 118/66, pulse 88, height 5\' 7"  (1.702 m), weight 211 lb (95.7 kg).    Vitals reviewed. Constitutional: She is oriented to person, place, and time. She appears well-developed and well-nourished.  HENT:  Head: Normocephalic and atraumatic.  Right Ear: External ear normal.  Left Ear: External ear normal.  Nose: Nose normal.  Mouth/Throat: Oropharynx is clear and moist.  Eyes: Conjunctivae and EOM are normal. Pupils are equal, round, and reactive to light. Right eye exhibits no discharge. Left eye exhibits no discharge. No scleral icterus.  Neck: Normal range of motion. Neck supple. No tracheal deviation present. No thyromegaly present.  Cardiovascular: Normal rate, regular rhythm, normal heart  sounds and intact distal pulses.  Exam reveals no gallop and no friction rub.   No murmur heard. Respiratory: Effort normal and breath sounds normal. No respiratory distress. She has no wheezes. She has no rales. She exhibits no tenderness.  GI: Soft. Bowel sounds are normal. She exhibits no distension and no mass. There is tenderness. There is no rebound and no guarding.  Genitourinary:       Vulva is normal without lesions Vagina is pink moist without discharge Cervix normal in appearance and pap is normal Uterus is normal size, contour, position, consistency, mobility, non-tender Adnexa is negative with normal sized ovaries by sonogram  Musculoskeletal: Normal range of motion. She exhibits no edema and no tenderness.  Neurological: She is  alert and oriented to person, place, and time. She has normal reflexes. She displays normal reflexes. No cranial nerve deficit. She exhibits normal muscle tone. Coordination normal.  Skin: Skin is warm and dry. No rash noted. No erythema. No pallor.  Psychiatric: She has a normal mood and affect. Her behavior is normal. Judgment and thought content normal.    Labs: No results found for this or any previous visit (from the past 336 hour(s)).  EKG: Orders placed or performed during the hospital encounter of 03/22/17  . EKG 12-Lead  . EKG 12-Lead    Imaging Studies: No results found.    Assessment: Menometrorrhagia Dysmenorrhea Fibroids Right lower quadrant pain Patient Active Problem List   Diagnosis Date Noted  . Closed head injury without loss of consciousness 06/11/2011    Plan: Abdominal supracervical hysterectomy with right salpingo oophorectomy and left salpingectomy, preservation of the left ovary unless otherwise indicated  12/15/2017  Pt understands the risks of surgery including but not limited t  excessive bleeding requiring transfusion or reoperation, post-operative infection requiring prolonged hospitalization or  re-hospitalization and antibiotic therapy, and damage to other organs including bladder, bowel, ureters and major vessels.  The patient also understands the alternative treatment options which were discussed in full.  All questions were answered.  Mertie Clause Eure 11/16/2017 11:45 AM     Florian Buff 11/16/2017 11:39 AM     Face to face time:  15 minutes  Greater than 50% of the visit time was spent in counseling and coordination of care with the patient.  The summary and outline of the counseling and care coordination is summarized in the note above.   All questions were answered.

## 2017-11-26 ENCOUNTER — Encounter: Payer: Self-pay | Admitting: Obstetrics & Gynecology

## 2017-12-07 DIAGNOSIS — Z029 Encounter for administrative examinations, unspecified: Secondary | ICD-10-CM

## 2017-12-09 ENCOUNTER — Other Ambulatory Visit: Payer: Self-pay | Admitting: Obstetrics & Gynecology

## 2017-12-09 NOTE — Patient Instructions (Signed)
Chloe Marks  12/09/2017     @PREFPERIOPPHARMACY @   Your procedure is scheduled on 12/15/2017  Report to [name] [email or web address].  Call this number if you have problems the morning of surgery:  [phone]   Remember:  No food or drink after midnight.     Take these medicines the morning of surgery with A SIP OF WATER Zyrtec, Colace, Lexapro, Flonase, Niferex, Megace, Zofran if needed    Do not wear jewelry, make-up or nail polish.  Do not wear lotions, powders, or perfumes, or deodorant.  Do not shave 48 hours prior to surgery.  Men may shave face and neck.  Do not bring valuables to the hospital.  [facility] is not responsible for any belongings or valuables.  Contacts, dentures or bridgework may not be worn into surgery.  Leave your suitcase in the car.  After surgery it may be brought to your room.  For patients admitted to the hospital, discharge time will be determined by your treatment team.  Patients discharged the day of surgery will not be allowed to drive home.    Please read over the following fact sheets that you were given. Surgical Site Infection Prevention, Anesthesia Post-op Instructions and Care and Recovery After Surgery     PATIENT INSTRUCTIONS POST-ANESTHESIA  IMMEDIATELY FOLLOWING SURGERY:  Do not drive or operate machinery for the first twenty four hours after surgery.  Do not make any important decisions for twenty four hours after surgery or while taking narcotic pain medications or sedatives.  If you develop intractable nausea and vomiting or a severe headache please notify your doctor immediately.  FOLLOW-UP:  Please make an appointment with your surgeon as instructed. You do not need to follow up with anesthesia unless specifically instructed to do so.  WOUND CARE INSTRUCTIONS (if applicable):  Keep a dry clean dressing on the anesthesia/puncture wound site if there is drainage.  Once the wound has quit draining you may leave it open to  air.  Generally you should leave the bandage intact for twenty four hours unless there is drainage.  If the epidural site drains for more than 36-48 hours please call the anesthesia department.  QUESTIONS?:  Please feel free to call your physician or the hospital operator if you have any questions, and they will be happy to assist you.      Unilateral Salpingo-Oophorectomy Unilateral salpingo-oophorectomy is the surgical removal of one fallopian tube and ovary. The ovaries are small organs that produce eggs in women. The fallopian tubes transport the egg from the ovary to the womb (uterus). A unilateral salpingo-oophorectomy may be done for various reasons, including:  Infection in the fallopian tube and ovary.  Scar tissue in the fallopian tube and ovary (adhesions).  A cyst or tumor on the ovary.  A need to remove the fallopian tube and ovary when removing the uterus.  Cancer of the fallopian tube or ovary.  The removal of one fallopian tube and ovary will not prevent you from becoming pregnant, put you into menopause, or cause problems with your menstrual periods or sex drive. Tell a health care provider about:  Any allergies you have.  All medicines you are taking, including vitamins, herbs, eye drops, creams, and over-the-counter medicines.  Previous problems you or family members have had with the use of anesthetics.  Any blood disorders you have.  Previous surgeries you have had.  Any medical conditions you have. What are the risks? Generally, this is a  safe procedure. However, as with any procedure, complications can occur. Possible complications include:  Injury to surrounding organs.  Bleeding.  Infection.  Blood clots in the legs or lungs.  Problems related to anesthesia.  What happens before the procedure?  Ask your health care provider about changing or stopping your regular medicines. You may need to stop taking certain medicines, such as aspirin or blood  thinners, at least 1 week before the surgery.  Do not eat or drink anything for at least 8 hours before the surgery.  If you smoke, do not smoke for at least 2 weeks before the surgery.  Make plans to have someone drive you home after the procedure or after your hospital stay. Also arrange for someone to help you with activities during recovery. What happens during the procedure?  You will be given medicine to help you relax before the procedure (sedative). You will then be given medicine to make you sleep through the procedure (general anesthetic). These medicines will be given through an IV access tube that is put into one of your veins.  Once you are asleep, your lower abdomen will be shaved and cleaned. A thin, flexible tube (catheter) will be placed in your bladder.  The surgeon may use a laparoscopic, robotic, or open technique for this surgery: ? In the laparoscopic technique, the surgery is done through two small cuts (incisions) in the abdomen. A thin, lighted tube with a tiny camera on the end (laparoscope) is inserted into one of the incisions. The tools needed for the procedure are put through the other incision. ? A robotic technique may be chosen to perform complex surgery in a small space. In the robotic technique, small incisions are made. A camera and surgical instruments are passed through the incisions. Surgical instruments are controlled with the help of a robotic arm. ? In the open technique, the surgery is done through one large incision in the abdomen.  Using any of these techniques, the surgeon will remove the fallopian tube and ovary. The blood vessels will be clamped and tied.  The surgeon will then use staples or stitches to close the incision or incisions. What happens after the procedure?  You will be taken to a recovery area where your progress will be monitored for 1-3 hours. Your blood pressure, pulse, and temperature will be checked often. You will remain in  the recovery area until you are stable and waking up.  If the laparoscopic technique was used, you may be allowed to go home after several hours. You may have some shoulder pain. This is normal and usually goes away in a day or two.  If the open technique was used, you will be admitted to the hospital for a couple of days.  You will be given pain medicine as necessary.  The IV tube and catheter will be removed before you are discharged. This information is not intended to replace advice given to you by your health care provider. Make sure you discuss any questions you have with your health care provider. Document Released: 04/26/2009 Document Revised: 05/29/2016 Document Reviewed: 12/21/2012 Elsevier Interactive Patient Education  2018 [place], also called tubectomy, is the surgical removal of one of the fallopian tubes. The fallopian tubes are where eggs travel from the ovaries to the uterus. Removing one fallopian tube does not prevent you from becoming pregnant. It also does not cause problems with your menstrual periods. You may need a salpingectomy if you:  Have a fertilized  egg that attaches to the fallopian tube (ectopic pregnancy), especially one that causes the tube to burst or tear (rupture).  Have an infected fallopian tube.  Have cancer of the fallopian tube or nearby organs.  Have had an ovary removed due to a cyst or tumor.  Have had your uterus removed.  There are three different methods that can be used for a salpingectomy:  Open. This method involves making one large incision in your abdomen.  Laparoscopic. This method involves using a thin, lighted tube with a tiny camera on the end (laparoscope) to help perform the procedure. The laparoscope will allow your surgeon to make several small incisions in the abdomen instead of a large incision.  Robot-assisted: This method involves using a computer to control surgical instruments that  are attached to robotic arms.  Tell a health care provider about:  Any allergies you have.  All medicines you are taking, including vitamins, herbs, eye drops, creams, and over-the-counter medicines.  Any problems you or family members have had with anesthetic medicines.  Any blood disorders you have.  Any surgeries you have had.  Any medical conditions you have.  Whether you are pregnant or may be pregnant. What are the risks? Generally, this is a safe procedure. However, problems may occur, including:  Infection.  Bleeding.  Allergic reactions to medicines.  Damage to other structures or organs.  Blood clots in the legs or lungs.  What happens before the procedure? Staying hydrated Follow instructions from your health care provider about hydration, which may include:  Up to 2 hours before the procedure - you may continue to drink clear liquids, such as water, clear fruit juice, black coffee, and plain tea.  Eating and drinking restrictions Follow instructions from your health care provider about eating and drinking, which may include:  8 hours before the procedure - stop eating heavy meals or foods such as meat, fried foods, or fatty foods.  6 hours before the procedure - stop eating light meals or foods, such as toast or cereal.  6 hours before the procedure - stop drinking milk or drinks that contain milk.  2 hours before the procedure - stop drinking clear liquids.  Medicines  Ask your health care provider about: ? Changing or stopping your regular medicines. This is especially important if you are taking diabetes medicines or blood thinners. ? Taking medicines such as aspirin and ibuprofen. These medicines can thin your blood. Do not take these medicines before your procedure if your health care provider instructs you not to.  You may be given antibiotic medicine to help prevent infection. General instructions  Do not smoke for at least 2 weeks before  your procedure. If you need help quitting, ask your health care provider.  You may have an exam or tests, such as an electrocardiogram (ECG).  You may have a blood or urine sample taken.  Ask your health care provider: ? Whether you should stop removing hair from your surgical area. ? How your surgical site will be marked or identified.  You may be asked to shower with a germ-killing soap.  Plan to have someone take you home from the hospital or clinic.  If you will be going home right after the procedure, plan to have someone with you for 24 hours. What happens during the procedure?  To reduce your risk of infection: ? Your health care team will wash or sanitize their hands. ? Hair may be removed from the surgical area. ? Your  skin will be washed with soap.  An IV tube will be inserted into one of your veins.  You will be given a medicine to make you fall asleep (general anesthetic). You may also be given a medicine to help you relax (sedative).  A thin tube (catheter) may be inserted through your urethra and into your bladder to drain urine during your procedure.  Depending on the type of procedure you are having, one incision or several small incisions will be made in your abdomen.  Your fallopian tube will be cut and removed from where it attaches to your uterus.  Your blood vessels will be clamped and tied to prevent excess bleeding.  The incision(s) in your abdomen will be closed with stitches (sutures), staples, or skin glue.  A bandage (dressing) may be placed over your incision(s). The procedure may vary among health care providers and hospitals. What happens after the procedure?  Your blood pressure, heart rate, breathing rate, and blood oxygen level will be monitored until the medicines you were given have worn off.  You may continue to receive fluids and medicines through an IV tube.  You may continue to have a catheter draining your urine.  You may have to  wear compression stockings. These stockings help to prevent blood clots and reduce swelling in your legs.  You will be given pain medicine as needed.  Do not drive for 24 hours if you received a sedative. Summary  Salpingectomy is a surgical procedure to remove one of the fallopian tubes.  The procedure may be done with an open incision, with a laparoscope, or with computer-controlled instruments.  Depending on the type of procedure you are having, one incision or several small incisions will be made in your abdomen.  Your blood pressure, heart rate, breathing rate, and blood oxygen level will be monitored until the medicines you were given have worn off.  Plan to have someone take you home from the hospital or clinic. This information is not intended to replace advice given to you by your health care provider. Make sure you discuss any questions you have with your health care provider. Document Released: 11/15/2008 Document Revised: 02/14/2016 Document Reviewed: 12/21/2012 Elsevier Interactive Patient Education  2018 [place] Hysterectomy A supracervical hysterectomy is surgery to remove the top part of the uterus, but not the cervix. You will no longer have menstrual periods or be able to get pregnant after this surgery. The fallopian tubes and ovaries may also be removed (bilateral salpingo-oophorectomy) during this surgery. This surgery is usually performed using a minimally invasive technique called laparoscopy. This technique allows the surgery to be done through small incisions. The minimally invasive technique provides benefits such as less pain, less risk of infection, and shorter recovery time. Tell a health care provider about:  Any allergies you have.  All medicines you are taking, including vitamins, herbs, eye drops, creams, and over-the-counter medicines.  Any problems you or family members have had with anesthetic medicines.  Any blood disorders you  have.  Any surgeries you have had.  Any medical conditions you have. What are the risks? Generally, this is a safe procedure. However, as with any procedure, complications can occur. Possible complications include:  Bleeding.  Blood clots in the legs or lung.  Infection.  Injury to surrounding organs.  Problems related to anesthesia.  Conversion to an open abdominal surgery.  Additional surgery later to remove the cervix if you have problems with the cervix.  What happens before  the procedure?  Ask your health care provider about changing or stopping your regular medicines.  Do not take aspirin or blood thinners (anticoagulants) for 1 week before the surgery, or as directed by your health care provider.  Do not eat or drink anything for 8 hours before the surgery, or as directed by your health care provider.  Quit smoking if you smoke.  Arrange for a ride home after surgery and for someone to help you at home during recovery. What happens during the procedure?  You will be given an antibiotic medicine.  An IV tube will be placed in one of your veins. You will be given medicine to make you sleep (general anesthetic).  A gas (carbon dioxide) will be used to inflate your abdomen. This will allow your surgeon to look inside your abdomen, perform your surgery, and treat any other problems found if necessary.  Three or four small incisions will be made in your abdomen. One of these incisions will be made in the area of your belly button (navel). A thin, flexible tube with a tiny camera and light on the end of it (laparoscope) will be inserted into the incision. The camera on the laparoscope sends a picture to a TV screen in the operating room. This gives your surgeon a good view inside the abdomen.  Other surgical instruments will be inserted through the other incisions.  The uterus will be cut into small pieces and removed through the small incisions.  Your incisions will be  closed. What happens after the procedure?  You will be taken to a recovery area where your progress will be monitored until you are awake, stable, and taking fluids well. If there are no other problems, you will then be moved to a regular hospital room, or you will be allowed to go home.  You will likely have minimal discomfort after the surgery because the incisions are so small with the laparoscopic technique.  You will be given pain medicine while you are in the hospital and for when you go home.  If a bilateral salpingo-oophorectomy was performed before menopause, you will go through a sudden (abrupt) menopause. This can be helped with hormone medicines. This information is not intended to replace advice given to you by your health care provider. Make sure you discuss any questions you have with your health care provider. Document Released: 12/16/2007 Document Revised: 12/05/2015 Document Reviewed: 12/30/2012 Elsevier Interactive Patient Education  [employer].

## 2017-12-10 ENCOUNTER — Encounter (HOSPITAL_COMMUNITY): Payer: Self-pay

## 2017-12-10 ENCOUNTER — Encounter (HOSPITAL_COMMUNITY)
Admission: RE | Admit: 2017-12-10 | Discharge: 2017-12-10 | Disposition: A | Discharge status: Home / Self Care | Payer: Commercial Managed Care - PPO | Source: Ambulatory Visit | Attending: Obstetrics & Gynecology | Admitting: Obstetrics & Gynecology

## 2017-12-10 ENCOUNTER — Other Ambulatory Visit: Payer: Self-pay

## 2017-12-10 DIAGNOSIS — N921 Excessive and frequent menstruation with irregular cycle: Secondary | ICD-10-CM | POA: Insufficient documentation

## 2017-12-10 DIAGNOSIS — D500 Iron deficiency anemia secondary to blood loss (chronic): Secondary | ICD-10-CM | POA: Diagnosis not present

## 2017-12-10 DIAGNOSIS — Z01818 Encounter for other preprocedural examination: Secondary | ICD-10-CM | POA: Diagnosis present

## 2017-12-10 DIAGNOSIS — D259 Leiomyoma of uterus, unspecified: Secondary | ICD-10-CM | POA: Diagnosis not present

## 2017-12-10 DIAGNOSIS — G8929 Other chronic pain: Secondary | ICD-10-CM | POA: Diagnosis not present

## 2017-12-10 DIAGNOSIS — R1031 Right lower quadrant pain: Secondary | ICD-10-CM | POA: Insufficient documentation

## 2017-12-10 HISTORY — DX: Anxiety disorder, unspecified: F41.9

## 2017-12-10 LAB — URINALYSIS, ROUTINE W REFLEX MICROSCOPIC
BILIRUBIN URINE: NEGATIVE
GLUCOSE, UA: NEGATIVE mg/dL
Ketones, ur: NEGATIVE mg/dL
NITRITE: NEGATIVE
PH: 5 (ref 5.0–8.0)
Protein, ur: 30 mg/dL — AB
RBC / HPF: >50 RBC/hpf — ABNORMAL HIGH (ref 0–5)
SPECIFIC GRAVITY, URINE: 1.025 (ref 1.005–1.030)

## 2017-12-10 LAB — CBC
HEMATOCRIT: 35.8 % — AB (ref 36.0–46.0)
HEMOGLOBIN: 11.1 g/dL — AB (ref 12.0–15.0)
MCH: 22.2 pg — AB (ref 26.0–34.0)
MCHC: 31 g/dL (ref 30.0–36.0)
MCV: 71.7 fL — AB (ref 78.0–100.0)
Platelets: 301 10*3/uL (ref 150–400)
RBC: 4.99 MIL/uL (ref 3.87–5.11)
RDW: 16.6 % — ABNORMAL HIGH (ref 11.5–15.5)
WBC: 6.5 10*3/uL (ref 4.0–10.5)

## 2017-12-10 LAB — HCG, QUANTITATIVE, PREGNANCY: hCG, Beta Chain, Quant, S: <1 m[IU]/mL (ref <5)

## 2017-12-10 LAB — COMPREHENSIVE METABOLIC PANEL
ALT: 20 U/L (ref 14–54)
ANION GAP: 11 (ref 5–15)
AST: 43 U/L — ABNORMAL HIGH (ref 15–41)
Albumin: 4.6 g/dL (ref 3.5–5.0)
Alkaline Phosphatase: 48 U/L (ref 38–126)
BILIRUBIN TOTAL: 0.5 mg/dL (ref 0.3–1.2)
BUN: 16 mg/dL (ref 6–20)
CALCIUM: 9.5 mg/dL (ref 8.9–10.3)
CO2: 24 mmol/L (ref 22–32)
Chloride: 104 mmol/L (ref 101–111)
Creatinine, Ser: 1.03 mg/dL — ABNORMAL HIGH (ref 0.44–1.00)
GFR calc non Af Amer: >60 mL/min (ref >60)
Glucose, Bld: 90 mg/dL (ref 65–99)
Potassium: 3.3 mmol/L — ABNORMAL LOW (ref 3.5–5.1)
Sodium: 139 mmol/L (ref 135–145)
TOTAL PROTEIN: 8.4 g/dL — AB (ref 6.5–8.1)

## 2017-12-10 LAB — HEMOGLOBIN A1C
HEMOGLOBIN A1C: 5.7 % — AB (ref 4.8–5.6)
Mean Plasma Glucose: 116.89 mg/dL

## 2017-12-10 LAB — GLUCOSE, CAPILLARY: GLUCOSE-CAPILLARY: 94 mg/dL (ref 65–99)

## 2017-12-10 LAB — RAPID HIV SCREEN (HIV 1/2 AB+AG)
HIV 1/2 ANTIBODIES: NONREACTIVE
HIV-1 P24 ANTIGEN - HIV24: NONREACTIVE

## 2017-12-11 LAB — TYPE AND SCREEN
ABO/RH(D): O POS
Antibody Screen: NEGATIVE

## 2017-12-14 NOTE — OR Nursing (Signed)
Potassium 3.3 reported to Dr. Rick Duff, no further orders needed. Also reported to lab , spoke with [facility].

## 2017-12-15 ENCOUNTER — Inpatient Hospital Stay (HOSPITAL_COMMUNITY)
Admission: RE | Admit: 2017-12-15 | Discharge: 2017-12-16 | DRG: 743 | Disposition: A | Discharge status: Home / Self Care | Payer: Commercial Managed Care - PPO | Attending: Obstetrics & Gynecology | Admitting: Obstetrics & Gynecology

## 2017-12-15 ENCOUNTER — Inpatient Hospital Stay (HOSPITAL_COMMUNITY): Payer: Commercial Managed Care - PPO | Admitting: Anesthesiology

## 2017-12-15 ENCOUNTER — Encounter (HOSPITAL_COMMUNITY)
Admission: RE | Disposition: A | Discharge status: Home / Self Care | Payer: Self-pay | Source: Home / Self Care | Attending: Obstetrics & Gynecology

## 2017-12-15 ENCOUNTER — Encounter (HOSPITAL_COMMUNITY): Payer: Self-pay | Admitting: *Deleted

## 2017-12-15 ENCOUNTER — Other Ambulatory Visit: Payer: Self-pay

## 2017-12-15 DIAGNOSIS — I10 Essential (primary) hypertension: Secondary | ICD-10-CM | POA: Diagnosis present

## 2017-12-15 DIAGNOSIS — D252 Subserosal leiomyoma of uterus: Secondary | ICD-10-CM | POA: Diagnosis present

## 2017-12-15 DIAGNOSIS — Z823 Family history of stroke: Secondary | ICD-10-CM

## 2017-12-15 DIAGNOSIS — N946 Dysmenorrhea, unspecified: Secondary | ICD-10-CM | POA: Diagnosis present

## 2017-12-15 DIAGNOSIS — Z807 Family history of other malignant neoplasms of lymphoid, hematopoietic and related tissues: Secondary | ICD-10-CM

## 2017-12-15 DIAGNOSIS — N921 Excessive and frequent menstruation with irregular cycle: Secondary | ICD-10-CM | POA: Diagnosis present

## 2017-12-15 DIAGNOSIS — R1031 Right lower quadrant pain: Secondary | ICD-10-CM | POA: Diagnosis not present

## 2017-12-15 DIAGNOSIS — Z87891 Personal history of nicotine dependence: Secondary | ICD-10-CM | POA: Diagnosis not present

## 2017-12-15 DIAGNOSIS — Z833 Family history of diabetes mellitus: Secondary | ICD-10-CM

## 2017-12-15 DIAGNOSIS — D259 Leiomyoma of uterus, unspecified: Secondary | ICD-10-CM | POA: Diagnosis present

## 2017-12-15 DIAGNOSIS — D251 Intramural leiomyoma of uterus: Secondary | ICD-10-CM | POA: Diagnosis not present

## 2017-12-15 DIAGNOSIS — Z8673 Personal history of transient ischemic attack (TIA), and cerebral infarction without residual deficits: Secondary | ICD-10-CM | POA: Diagnosis not present

## 2017-12-15 DIAGNOSIS — Z8249 Family history of ischemic heart disease and other diseases of the circulatory system: Secondary | ICD-10-CM

## 2017-12-15 DIAGNOSIS — Z90710 Acquired absence of both cervix and uterus: Secondary | ICD-10-CM | POA: Diagnosis present

## 2017-12-15 HISTORY — PX: SALPINGOOPHORECTOMY: SHX82

## 2017-12-15 HISTORY — PX: SUPRACERVICAL ABDOMINAL HYSTERECTOMY: SHX5393

## 2017-12-15 HISTORY — PX: UNILATERAL SALPINGECTOMY: SHX6160

## 2017-12-15 SURGERY — HYSTERECTOMY, SUPRACERVICAL, ABDOMINAL
Anesthesia: General | Site: Abdomen

## 2017-12-15 MED ORDER — SODIUM CHLORIDE 0.9 % IJ SOLN
INTRAMUSCULAR | Status: AC
  Filled 2017-12-15: qty 20

## 2017-12-15 MED ORDER — HEMOSTATIC AGENTS (NO CHARGE) OPTIME
TOPICAL | Status: DC | PRN
  Administered 2017-12-15: 1 via TOPICAL

## 2017-12-15 MED ORDER — CEFAZOLIN SODIUM-DEXTROSE 2-4 GM/100ML-% IV SOLN
INTRAVENOUS | Status: AC
Start: 2017-12-15 — End: ?
  Filled 2017-12-15: qty 100

## 2017-12-15 MED ORDER — HYDROCHLOROTHIAZIDE 12.5 MG PO CAPS
12.5000 mg | ORAL_CAPSULE | Freq: Every day | ORAL | Status: DC
  Administered 2017-12-16: 12.5 mg via ORAL
  Filled 2017-12-15: qty 1

## 2017-12-15 MED ORDER — LIDOCAINE HCL (PF) 1 % IJ SOLN
INTRAMUSCULAR | Status: AC
  Filled 2017-12-15: qty 5

## 2017-12-15 MED ORDER — SUGAMMADEX SODIUM 500 MG/5ML IV SOLN
INTRAVENOUS | Status: DC | PRN
  Administered 2017-12-15: 200 mg via INTRAVENOUS

## 2017-12-15 MED ORDER — FENTANYL CITRATE (PF) 100 MCG/2ML IJ SOLN
INTRAMUSCULAR | Status: DC | PRN
  Administered 2017-12-15 (×2): 50 ug via INTRAVENOUS
  Administered 2017-12-15: 100 ug via INTRAVENOUS
  Administered 2017-12-15: 50 ug via INTRAVENOUS

## 2017-12-15 MED ORDER — ZOLPIDEM TARTRATE 5 MG PO TABS: 5.0000 mg | ORAL_TABLET | Freq: Every evening | ORAL | Status: DC | PRN

## 2017-12-15 MED ORDER — ROCURONIUM BROMIDE 50 MG/5ML IV SOLN
INTRAVENOUS | Status: AC
  Filled 2017-12-15: qty 1

## 2017-12-15 MED ORDER — MIDAZOLAM HCL 5 MG/5ML IJ SOLN
INTRAMUSCULAR | Status: DC | PRN
  Administered 2017-12-15: 2 mg via INTRAVENOUS

## 2017-12-15 MED ORDER — HYDROCODONE-ACETAMINOPHEN 7.5-325 MG PO TABS: 1.0000 | ORAL_TABLET | Freq: Once | ORAL | Status: DC | PRN

## 2017-12-15 MED ORDER — KETOROLAC TROMETHAMINE 30 MG/ML IJ SOLN
30.0000 mg | Freq: Once | INTRAMUSCULAR | Status: AC
  Administered 2017-12-15: 30 mg via INTRAVENOUS

## 2017-12-15 MED ORDER — PROPOFOL 10 MG/ML IV BOLUS
INTRAVENOUS | Status: AC
Start: 2017-12-15 — End: ?
  Filled 2017-12-15: qty 40

## 2017-12-15 MED ORDER — FENTANYL CITRATE (PF) 100 MCG/2ML IJ SOLN
INTRAMUSCULAR | Status: AC
  Filled 2017-12-15: qty 2

## 2017-12-15 MED ORDER — LISINOPRIL 10 MG PO TABS
10.0000 mg | ORAL_TABLET | Freq: Every day | ORAL | Status: DC
Start: 2017-12-15 — End: 2017-12-16
  Administered 2017-12-16: 10 mg via ORAL
  Filled 2017-12-15: qty 1

## 2017-12-15 MED ORDER — SENNOSIDES-DOCUSATE SODIUM 8.6-50 MG PO TABS: 1.0000 | ORAL_TABLET | Freq: Every evening | ORAL | Status: DC | PRN

## 2017-12-15 MED ORDER — FENTANYL CITRATE (PF) 250 MCG/5ML IJ SOLN
INTRAMUSCULAR | Status: AC
  Filled 2017-12-15: qty 5

## 2017-12-15 MED ORDER — SUGAMMADEX SODIUM 500 MG/5ML IV SOLN
INTRAVENOUS | Status: AC
  Filled 2017-12-15: qty 5

## 2017-12-15 MED ORDER — LISINOPRIL-HYDROCHLOROTHIAZIDE 10-12.5 MG PO TABS: 1.0000 | ORAL_TABLET | Freq: Every day | ORAL | Status: DC

## 2017-12-15 MED ORDER — MIDAZOLAM HCL 2 MG/2ML IJ SOLN
INTRAMUSCULAR | Status: AC
  Filled 2017-12-15: qty 2

## 2017-12-15 MED ORDER — BUPIVACAINE LIPOSOME 1.3 % IJ SUSP
INTRAMUSCULAR | Status: DC | PRN
  Administered 2017-12-15: 20 mL

## 2017-12-15 MED ORDER — MEPERIDINE HCL 50 MG/ML IJ SOLN: 6.2500 mg | INTRAMUSCULAR | Status: DC | PRN

## 2017-12-15 MED ORDER — DOCUSATE SODIUM 100 MG PO CAPS
100.0000 mg | ORAL_CAPSULE | Freq: Two times a day (BID) | ORAL | Status: DC
  Administered 2017-12-15 – 2017-12-16 (×2): 100 mg via ORAL
  Filled 2017-12-15 (×2): qty 1

## 2017-12-15 MED ORDER — LORATADINE 10 MG PO TABS
10.0000 mg | ORAL_TABLET | Freq: Every day | ORAL | Status: DC
  Filled 2017-12-15: qty 1

## 2017-12-15 MED ORDER — OXYCODONE-ACETAMINOPHEN 7.5-325 MG PO TABS
1.0000 | ORAL_TABLET | ORAL | Status: DC | PRN
  Administered 2017-12-15 – 2017-12-16 (×4): 2 via ORAL
  Filled 2017-12-15 (×2): qty 2
  Filled 2017-12-15: qty 1
  Filled 2017-12-15 (×2): qty 2
  Filled 2017-12-15: qty 1

## 2017-12-15 MED ORDER — ALUM & MAG HYDROXIDE-SIMETH 200-200-20 MG/5ML PO SUSP: 30.0000 mL | ORAL | Status: DC | PRN

## 2017-12-15 MED ORDER — HYDROMORPHONE HCL 1 MG/ML IJ SOLN
0.2500 mg | INTRAMUSCULAR | Status: DC | PRN
Start: 2017-12-15 — End: 2017-12-15
  Administered 2017-12-15 (×4): 0.5 mg via INTRAVENOUS
  Filled 2017-12-15 (×4): qty 0.5

## 2017-12-15 MED ORDER — BISACODYL 10 MG RE SUPP: 10.0000 mg | Freq: Every day | RECTAL | Status: DC | PRN

## 2017-12-15 MED ORDER — PROMETHAZINE HCL 25 MG/ML IJ SOLN: 25.0000 mg | Freq: Four times a day (QID) | INTRAMUSCULAR | Status: DC | PRN

## 2017-12-15 MED ORDER — CEFAZOLIN SODIUM-DEXTROSE 2-4 GM/100ML-% IV SOLN
2.0000 g | INTRAVENOUS | Status: AC
  Administered 2017-12-15: 2 g via INTRAVENOUS

## 2017-12-15 MED ORDER — BUPIVACAINE LIPOSOME 1.3 % IJ SUSP
INTRAMUSCULAR | Status: AC
  Filled 2017-12-15: qty 20

## 2017-12-15 MED ORDER — DIPHENHYDRAMINE HCL 50 MG/ML IJ SOLN
25.0000 mg | Freq: Four times a day (QID) | INTRAMUSCULAR | Status: DC | PRN
  Administered 2017-12-15: 25 mg via INTRAVENOUS
  Filled 2017-12-15: qty 1

## 2017-12-15 MED ORDER — ONDANSETRON HCL 4 MG PO TABS: 8.0000 mg | ORAL_TABLET | Freq: Four times a day (QID) | ORAL | Status: DC | PRN

## 2017-12-15 MED ORDER — KETOROLAC TROMETHAMINE 30 MG/ML IJ SOLN: 30.0000 mg | Freq: Once | INTRAMUSCULAR | Status: DC | PRN

## 2017-12-15 MED ORDER — ONDANSETRON HCL 4 MG/2ML IJ SOLN
INTRAMUSCULAR | Status: DC | PRN
  Administered 2017-12-15: 4 mg via INTRAVENOUS

## 2017-12-15 MED ORDER — ASPIRIN EC 81 MG PO TBEC
81.0000 mg | DELAYED_RELEASE_TABLET | Freq: Every day | ORAL | Status: DC
  Administered 2017-12-15 – 2017-12-16 (×2): 81 mg via ORAL
  Filled 2017-12-15 (×2): qty 1

## 2017-12-15 MED ORDER — LIDOCAINE HCL 1 % IJ SOLN
INTRAMUSCULAR | Status: DC | PRN
  Administered 2017-12-15: 30 mg via INTRADERMAL

## 2017-12-15 MED ORDER — KETOROLAC TROMETHAMINE 30 MG/ML IJ SOLN
INTRAMUSCULAR | Status: AC
  Filled 2017-12-15: qty 1

## 2017-12-15 MED ORDER — HYDROMORPHONE HCL 1 MG/ML IJ SOLN
1.0000 mg | INTRAMUSCULAR | Status: DC | PRN
  Administered 2017-12-15 (×2): 1 mg via INTRAVENOUS
  Filled 2017-12-15 (×2): qty 1

## 2017-12-15 MED ORDER — KCL IN DEXTROSE-NACL 20-5-0.45 MEQ/L-%-% IV SOLN
INTRAVENOUS | Status: DC
  Administered 2017-12-15 (×2): via INTRAVENOUS
  Filled 2017-12-15 (×4): qty 1000

## 2017-12-15 MED ORDER — BUPIVACAINE LIPOSOME 1.3 % IJ SUSP
20.0000 mL | Freq: Once | INTRAMUSCULAR | Status: DC
  Filled 2017-12-15: qty 20

## 2017-12-15 MED ORDER — 0.9 % SODIUM CHLORIDE (POUR BTL) OPTIME
TOPICAL | Status: DC | PRN
  Administered 2017-12-15: 1000 mL

## 2017-12-15 MED ORDER — DOCUSATE SODIUM 100 MG PO CAPS: 100.0000 mg | ORAL_CAPSULE | Freq: Every day | ORAL | Status: DC

## 2017-12-15 MED ORDER — SODIUM CHLORIDE 0.9 % IV SOLN
8.0000 mg | Freq: Four times a day (QID) | INTRAVENOUS | Status: DC | PRN
  Filled 2017-12-15: qty 4

## 2017-12-15 MED ORDER — ONDANSETRON 4 MG PO TBDP
8.0000 mg | ORAL_TABLET | Freq: Three times a day (TID) | ORAL | Status: DC | PRN
  Administered 2017-12-15 (×2): 8 mg via ORAL
  Filled 2017-12-15 (×2): qty 2

## 2017-12-15 MED ORDER — ONDANSETRON HCL 4 MG/2ML IJ SOLN
4.0000 mg | Freq: Once | INTRAMUSCULAR | Status: AC | PRN
  Administered 2017-12-15: 4 mg via INTRAVENOUS
  Filled 2017-12-15: qty 2

## 2017-12-15 MED ORDER — ESCITALOPRAM OXALATE 10 MG PO TABS
10.0000 mg | ORAL_TABLET | Freq: Every day | ORAL | Status: DC
  Administered 2017-12-16: 10 mg via ORAL
  Filled 2017-12-15: qty 1

## 2017-12-15 MED ORDER — ROCURONIUM BROMIDE 100 MG/10ML IV SOLN
INTRAVENOUS | Status: DC | PRN
  Administered 2017-12-15: 50 mg via INTRAVENOUS

## 2017-12-15 MED ORDER — KETOROLAC TROMETHAMINE 30 MG/ML IJ SOLN
30.0000 mg | Freq: Four times a day (QID) | INTRAMUSCULAR | Status: AC
  Administered 2017-12-15: 30 mg via INTRAVENOUS
  Filled 2017-12-15: qty 1

## 2017-12-15 MED ORDER — PROPOFOL 10 MG/ML IV BOLUS
INTRAVENOUS | Status: DC | PRN
  Administered 2017-12-15: 160 mg via INTRAVENOUS

## 2017-12-15 MED ORDER — LACTATED RINGERS IV SOLN
INTRAVENOUS | Status: DC
  Administered 2017-12-15 (×2): via INTRAVENOUS

## 2017-12-15 MED ORDER — ONDANSETRON HCL 4 MG/2ML IJ SOLN
INTRAMUSCULAR | Status: AC
  Filled 2017-12-15: qty 2

## 2017-12-15 MED ORDER — ZOLPIDEM TARTRATE 5 MG PO TABS: 10.0000 mg | ORAL_TABLET | Freq: Every evening | ORAL | Status: DC | PRN

## 2017-12-15 MED ORDER — FLUTICASONE PROPIONATE 50 MCG/ACT NA SUSP
2.0000 | Freq: Every day | NASAL | Status: DC
  Filled 2017-12-15: qty 16

## 2017-12-15 SURGICAL SUPPLY — 45 items
ADH SKN CLS APL DERMABOND .7 (GAUZE/BANDAGES/DRESSINGS) ×3
BLADE SURG SZ10 CARB STEEL (BLADE) ×5 IMPLANT
CELLS DAT CNTRL 66122 CELL SVR (MISCELLANEOUS) ×3 IMPLANT
CLOTH BEACON ORANGE TIMEOUT ST (SAFETY) ×5 IMPLANT
COVER LIGHT HANDLE STERIS (MISCELLANEOUS) ×10 IMPLANT
DERMABOND ADVANCED (GAUZE/BANDAGES/DRESSINGS) ×2
DERMABOND ADVANCED .7 DNX12 (GAUZE/BANDAGES/DRESSINGS) ×3 IMPLANT
DRAPE WARM FLUID 44X44 (DRAPE) ×5 IMPLANT
DRSG OPSITE POSTOP 4X8 (GAUZE/BANDAGES/DRESSINGS) ×5 IMPLANT
ELECT REM PT RETURN 9FT ADLT (ELECTROSURGICAL) ×5
ELECTRODE REM PT RTRN 9FT ADLT (ELECTROSURGICAL) ×3 IMPLANT
GAUZE SPONGE 4X4 12PLY STRL (GAUZE/BANDAGES/DRESSINGS) ×5 IMPLANT
GLOVE BIOGEL PI IND STRL 7.0 (GLOVE) ×3 IMPLANT
GLOVE BIOGEL PI IND STRL 8 (GLOVE) ×3 IMPLANT
GLOVE BIOGEL PI INDICATOR 7.0 (GLOVE) ×2
GLOVE BIOGEL PI INDICATOR 8 (GLOVE) ×2
GLOVE ECLIPSE 8.0 STRL XLNG CF (GLOVE) ×5 IMPLANT
GOWN STRL REUS W/ TWL LRG LVL3 (GOWN DISPOSABLE) ×6 IMPLANT
GOWN STRL REUS W/TWL LRG LVL3 (GOWN DISPOSABLE) ×10
GOWN STRL REUS W/TWL XL LVL3 (GOWN DISPOSABLE) ×5 IMPLANT
HEMOSTAT ARISTA ABSORB 3G PWDR (MISCELLANEOUS) ×4 IMPLANT
INST SET MAJOR GENERAL (KITS) ×5 IMPLANT
KIT TURNOVER KIT A (KITS) ×5 IMPLANT
MANIFOLD NEPTUNE II (INSTRUMENTS) ×5 IMPLANT
NDL HYPO 21X1.5 SAFETY (NEEDLE) ×1 IMPLANT
NEEDLE HYPO 21X1.5 SAFETY (NEEDLE) ×5 IMPLANT
NS IRRIG 1000ML POUR BTL (IV SOLUTION) ×10 IMPLANT
PACK ABDOMINAL MAJOR (CUSTOM PROCEDURE TRAY) ×5 IMPLANT
PAD ARMBOARD 7.5X6 YLW CONV (MISCELLANEOUS) ×5 IMPLANT
RETRACTOR WND ALEXIS 18 MED (MISCELLANEOUS) IMPLANT
RTRCTR WOUND ALEXIS 18CM MED (MISCELLANEOUS) ×5
SET BASIN LINEN APH (SET/KITS/TRAYS/PACK) ×5 IMPLANT
SUT CHROMIC 0 CT 1 (SUTURE) ×5 IMPLANT
SUT MNCRL+ AB 3-0 CT1 36 (SUTURE) IMPLANT
SUT MON AB 3-0 SH 27 (SUTURE) ×14 IMPLANT
SUT MONOCRYL AB 3-0 CT1 36IN (SUTURE)
SUT PLAIN 2 0 XLH (SUTURE) IMPLANT
SUT VIC AB 0 CT1 27 (SUTURE) ×15
SUT VIC AB 0 CT1 27XCR 8 STRN (SUTURE) ×8 IMPLANT
SUT VIC AB 0 CTX 36 (SUTURE) ×5
SUT VIC AB 0 CTX36XBRD ANTBCTR (SUTURE) ×3 IMPLANT
SUT VICRYL 3 0 (SUTURE) IMPLANT
SYR 20CC LL (SYRINGE) ×5 IMPLANT
TOWEL BLUE STERILE X RAY DET (MISCELLANEOUS) ×4 IMPLANT
TRAY FOLEY MTR SLVR 16FR STAT (SET/KITS/TRAYS/PACK) ×5 IMPLANT

## 2017-12-15 NOTE — H&P (Signed)
Preoperative History and Physical  Chloe Marks is a 42 y.o. [id number] with No LMP recorded. admitted for abdominal supracervical hysterectomy with removal of right tube and ovary and left tube, preservation of left ovary.  Patient has been on megestrol now for about 5 months to control her menses which has helped significantly but not perfectly She has a history of severe pain and heavy periods with clots soils her clothes and sheets.  Sonogram reveals small myomas.  Additionally she has chronic right lower quadrant, consistently most days, not necessarily associated with the bleeding No dyspareunia  PMH:        Past Medical History:  Diagnosis Date  . Anemia   . Family history of adverse reaction to anesthesia    PONV  . Fibroids   . Heart murmur   . Hypertension   . Ovarian cyst   . Pre-diabetes   . Stroke [facility]) august 2011   after gall bladder surgery; has lesion of left side of brain    PSH:          Past Surgical History:  Procedure Laterality Date  . CHOLECYSTECTOMY    . DILITATION & CURRETTAGE/HYSTROSCOPY WITH NOVASURE ABLATION N/A 03/24/2017   Procedure: DILATATION & CURETTAGE/HYSTEROSCOPY WITH NOVASURE ABLATION;  Surgeon: Florian Buff, MD;  Location: [facility];  Service: Gynecology;  Laterality: N/A;  . TUBAL LIGATION      POb/GynH:              OB History    Gravida  3   Para  2   Term  2   Preterm      AB  1   Living  2     SAB  1   TAB      Ectopic      Multiple      Live Births  2           SH:   Social History        Tobacco Use  . Smoking status: Former Smoker    Packs/day: 0.25    Years: 4.00    Pack years: 1.00    Types: Cigarettes, Cigars    Last attempt to quit: 03/22/2014    Years since quitting: 3.6  . Smokeless tobacco: Never Used  Substance Use Topics  . Alcohol use: No  . Drug use: No    FH:         Family History  Problem Relation Age of Onset  . Hypertension  Mother   . Non-Hodgkin's lymphoma Sister   . Other Paternal Grandmother        heart issues  . Hypertension Brother   . Diabetes Brother   . Stroke Brother   . Fibroids Sister      Allergies:       Allergies  Allergen Reactions  . Vicodin [Hydrocodone-Acetaminophen] Nausea And Vomiting and Other (See Comments)    dizziness    Medications:       Current Outpatient Medications:  .  aspirin EC 81 MG tablet, Take 81 mg by mouth daily., Disp: , Rfl:  .  docusate sodium (COLACE) 100 MG capsule, Take 100 mg by mouth daily., Disp: , Rfl:  .  escitalopram (LEXAPRO) 10 MG tablet, Take 10 mg by mouth daily., Disp: , Rfl:  .  fluticasone (FLONASE) 50 MCG/ACT nasal spray, Place 2 sprays into both nostrils daily., Disp: 9.9 g, Rfl: 0 .  ibuprofen (ADVIL,MOTRIN) 800 MG tablet, Take 800 mg by  mouth every 8 (eight) hours as needed., Disp: , Rfl:  .  iron polysaccharides (NIFEREX) 150 MG capsule, Take 150 mg by mouth daily., Disp: , Rfl:  .  ketorolac (TORADOL) 10 MG tablet, Take 1 tablet (10 mg total) by mouth every 8 (eight) hours as needed., Disp: 15 tablet, Rfl: 0 .  lisinopril-hydrochlorothiazide (PRINZIDE,ZESTORETIC) 10-12.5 MG per tablet, Take 1 tablet by mouth daily.  , Disp: , Rfl:  .  megestrol (MEGACE) 40 MG tablet, Take 40 mg by mouth daily., Disp: , Rfl:  .  ondansetron (ZOFRAN ODT) 8 MG disintegrating tablet, Take 1 tablet (8 mg total) by mouth every 8 (eight) hours as needed for nausea or vomiting., Disp: 20 tablet, Rfl: 0 .  Cranberry 500 MG CHEW, Chew by mouth 2 (two) times daily., Disp: , Rfl:  .  dextromethorphan-guaiFENesin (MUCINEX DM) 30-600 MG 12hr tablet, Take 1 tablet by mouth 2 (two) times daily. (Patient not taking: Reported on 11/16/2017), Disp: 10 tablet, Rfl: 0 .  megestrol (MEGACE) 40 MG tablet, 3 tablets a day for 5 days, 2 tablets a day for 5 days then 1 tablet daily, Disp: 45 tablet, Rfl: 3  Review of Systems:   Review of Systems  Constitutional:  Negative for fever, chills, weight loss, malaise/fatigue and diaphoresis.  HENT: Negative for hearing loss, ear pain, nosebleeds, congestion, sore throat, neck pain, tinnitus and ear discharge.   Eyes: Negative for blurred vision, double vision, photophobia, pain, discharge and redness.  Respiratory: Negative for cough, hemoptysis, sputum production, shortness of breath, wheezing and stridor.   Cardiovascular: Negative for chest pain, palpitations, orthopnea, claudication, leg swelling and PND.  Gastrointestinal: Positive for abdominal pain. Negative for heartburn, nausea, vomiting, diarrhea, constipation, blood in stool and melena.  Genitourinary: Negative for dysuria, urgency, frequency, hematuria and flank pain.  Musculoskeletal: Negative for myalgias, back pain, joint pain and falls.  Skin: Negative for itching and rash.  Neurological: Negative for dizziness, tingling, tremors, sensory change, speech change, focal weakness, seizures, loss of consciousness, weakness and headaches.  Endo/Heme/Allergies: Negative for environmental allergies and polydipsia. Does not bruise/bleed easily.  Psychiatric/Behavioral: Negative for depression, suicidal ideas, hallucinations, memory loss and substance abuse. The patient is not nervous/anxious and does not have insomnia.      PHYSICAL EXAM:  Blood pressure 118/66, pulse 88, height 5\' 7"  (1.702 m), weight 211 lb (95.7 kg).    Vitals reviewed. Constitutional: She is oriented to person, place, and time. She appears well-developed and well-nourished.  HENT:  Head: Normocephalic and atraumatic.  Right Ear: External ear normal.  Left Ear: External ear normal.  Nose: Nose normal.  Mouth/Throat: Oropharynx is clear and moist.  Eyes: Conjunctivae and EOM are normal. Pupils are equal, round, and reactive to light. Right eye exhibits no discharge. Left eye exhibits no discharge. No scleral icterus.  Neck: Normal range of motion. Neck supple. No tracheal  deviation present. No thyromegaly present.  Cardiovascular: Normal rate, regular rhythm, normal heart sounds and intact distal pulses.  Exam reveals no gallop and no friction rub.   No murmur heard. Respiratory: Effort normal and breath sounds normal. No respiratory distress. She has no wheezes. She has no rales. She exhibits no tenderness.  GI: Soft. Bowel sounds are normal. She exhibits no distension and no mass. There is tenderness. There is no rebound and no guarding.  Genitourinary:       Vulva is normal without lesions Vagina is pink moist without discharge Cervix normal in appearance and pap is normal Uterus  is normal size, contour, position, consistency, mobility, non-tender Adnexa is negative with normal sized ovaries by sonogram  Musculoskeletal: Normal range of motion. She exhibits no edema and no tenderness.  Neurological: She is alert and oriented to person, place, and time. She has normal reflexes. She displays normal reflexes. No cranial nerve deficit. She exhibits normal muscle tone. Coordination normal.  Skin: Skin is warm and dry. No rash noted. No erythema. No pallor.  Psychiatric: She has a normal mood and affect. Her behavior is normal. Judgment and thought content normal.    Labs: Results for orders placed or performed during the hospital encounter of 12/10/17 (from the past 168 hour(s))  Glucose, capillary   Collection Time: 12/10/17  2:10 PM  Result Value Ref Range   Glucose-Capillary 94 65 - 99 mg/dL  Hemoglobin A1c   Collection Time: 12/10/17  2:13 PM  Result Value Ref Range   Hgb A1c MFr Bld 5.7 (H) 4.8 - 5.6 %   Mean Plasma Glucose 116.89 mg/dL  CBC   Collection Time: 12/10/17  2:13 PM  Result Value Ref Range   WBC 6.5 4.0 - 10.5 K/uL   RBC 4.99 3.87 - 5.11 MIL/uL   Hemoglobin 11.1 (L) 12.0 - 15.0 g/dL   HCT 35.8 (L) 36.0 - 46.0 %   MCV 71.7 (L) 78.0 - 100.0 fL   MCH 22.2 (L) 26.0 - 34.0 pg   MCHC 31.0 30.0 - 36.0 g/dL   RDW 16.6 (H) 11.5 - 15.5 %    Platelets 301 150 - 400 K/uL  Comprehensive metabolic panel   Collection Time: 12/10/17  2:13 PM  Result Value Ref Range   Sodium 139 135 - 145 mmol/L   Potassium 3.3 (L) 3.5 - 5.1 mmol/L   Chloride 104 101 - 111 mmol/L   CO2 24 22 - 32 mmol/L   Glucose, Bld 90 65 - 99 mg/dL   BUN 16 6 - 20 mg/dL   Creatinine, Ser 1.03 (H) 0.44 - 1.00 mg/dL   Calcium 9.5 8.9 - 10.3 mg/dL   Total Protein 8.4 (H) 6.5 - 8.1 g/dL   Albumin 4.6 3.5 - 5.0 g/dL   AST 43 (H) 15 - 41 U/L   ALT 20 14 - 54 U/L   Alkaline Phosphatase 48 38 - 126 U/L   Total Bilirubin 0.5 0.3 - 1.2 mg/dL   GFR calc non Af Amer >60 >60 mL/min   GFR calc Af Amer >60 >60 mL/min   Anion gap 11 5 - 15  hCG, quantitative, pregnancy   Collection Time: 12/10/17  2:13 PM  Result Value Ref Range   hCG, Beta Chain, Quant, S <1 <5 mIU/mL  Rapid HIV screen (HIV 1/2 Ab+Ag)   Collection Time: 12/10/17  2:13 PM  Result Value Ref Range   HIV-1 P24 Antigen - HIV24 NON REACTIVE NON REACTIVE   HIV 1/2 Antibodies NON REACTIVE NON REACTIVE   Interpretation (HIV Ag Ab)      A non reactive test result means that HIV 1 or HIV 2 antibodies and HIV 1 p24 antigen were not detected in the specimen.  Urinalysis, Routine w reflex microscopic   Collection Time: 12/10/17  2:13 PM  Result Value Ref Range   Color, Urine YELLOW YELLOW   APPearance HAZY (A) CLEAR   Specific Gravity, Urine 1.025 1.005 - 1.030   pH 5.0 5.0 - 8.0   Glucose, UA NEGATIVE NEGATIVE mg/dL   Hgb urine dipstick LARGE (A) NEGATIVE   Bilirubin Urine NEGATIVE  NEGATIVE   Ketones, ur NEGATIVE NEGATIVE mg/dL   Protein, ur 30 (A) NEGATIVE mg/dL   Nitrite NEGATIVE NEGATIVE   Leukocytes, UA SMALL (A) NEGATIVE   RBC / HPF >50 (H) 0 - 5 RBC/hpf   WBC, UA 11-20 0 - 5 WBC/hpf   Bacteria, UA RARE (A) NONE SEEN   Squamous Epithelial / LPF 11-20 0 - 5   Mucus PRESENT   Type and screen   Collection Time: 12/10/17  2:13 PM  Result Value Ref Range   ABO/RH(D) O POS    Antibody Screen  NEG    Sample Expiration 12/24/2017    Extend sample reason      NO TRANSFUSIONS OR PREGNANCY IN THE PAST 3 MONTHS Performed at [facility], [address] [place], [place] [address]      EKG:    Orders placed or performed during the hospital encounter of 03/22/17  . EKG 12-Lead  . EKG 12-Lead    Imaging Studies:  GYNECOLOGIC SONOGRAM   Chloe Marks is a 42 y.o. [id number] LMP 05/29/2017,she is here for a pelvic sonogram for menorrhagia with RLQ pain.  Uterus                      12.3 x 5 x 6.7 cm, vol 215 ml, enlarged heterogeneous anteverted uterus w/mult fibroids  Endometrium          13 mm, symmetrical, wnl  Right ovary             2.1 x 1.4 x 2.3 cm, wnl  Left ovary                2 x 1.3 x 2.1 cm, wnl  Largest fibroids (#1)fundal intramural 2.1 x 1.6 x 2 cm,(#2) anterior subserosal fibroid 3 x 2.6 x 3.2 cm,(#3) subserosal posterior fibroid 2.6 x 2.2 x 2.7 cm  Technician Comments:  PELVIC US TA/TV: heterogeneous anteverted uterus w/mult fibroids,largest fibroids (#1)fundal intramural 2.1 x 1.6 x 2 cm,(#2) ant subserosal fibroid 3 x 2.6 x 3.2 cm,(#3) subserosal posterior fibroid 2.6 x 2.2 x 2.7 cm,EEC 13 mm,normal ovaries bilat,ovaries appear mobile,no free fluid,right adnexal pain during ultrasound    [employer] 06/24/2017 1:44 PM  Clinical Impression and recommendations:  I have reviewed the sonogram results above, combined with the patient's current clinical course, below are my impressions and any appropriate recommendations for management based on the sonographic findings.  Mildly enlarged uterus with 3 small myomas Endometrium is normal Both ovaries are normal   Mertie Clause Ahijah Devery   Assessment: Menometrorrhagia Dysmenorrhea Fibroids Right lower quadrant pain     Patient Active Problem List   Diagnosis Date Noted  . Closed head injury without loss of consciousness 06/11/2011    Plan: Abdominal supracervical  hysterectomy with right salpingo oophorectomy and left salpingectomy, preservation of the left ovary unless otherwise indicated  12/15/2017  Pt understands the risks of surgery including but not limited t  excessive bleeding requiring transfusion or reoperation, post-operative infection requiring prolonged hospitalization or re-hospitalization and antibiotic therapy, and damage to other organs including bladder, bowel, ureters and major vessels.  The patient also understands the alternative treatment options which were discussed in full.  All questions were answered.  Florian Buff 11/16/2017 11:45 AM

## 2017-12-15 NOTE — Progress Notes (Signed)
Patient requested pain medication this evening after eating supper. Reported pain 9/10 on 0-10 pain scale. Discussed use of percocet for pain once she had eaten earlier this evening and patient was agreeable but requested dilaudid IV for severe pain at 1836. Given as ordered PRN for pain. On reassessment, patient reported pain decreased to 6/10 on 0-10 pain scale. Post-op education regarding diet, incision care, cough and deep breathing, incentive spirometer and pain management discussed with patient this afternoon and evening. Verbalized understanding. Donavan Foil, RN

## 2017-12-15 NOTE — Transfer of Care (Signed)
Immediate Anesthesia Transfer of Care Note  Patient: Chloe Marks  Procedure(s) Performed: HYSTERECTOMY SUPRACERVICAL ABDOMINAL (N/A Abdomen) RIGHT SALPINGO OOPHORECTOMY (N/A Abdomen) LEFT SALPINGECTOMY (N/A Abdomen)  Patient Location: PACU  Anesthesia Type:General  Level of Consciousness: drowsy and patient cooperative  Airway & Oxygen Therapy: Patient Spontanous Breathing and Patient connected to face mask oxygen  Post-op Assessment: Report given to RN, Post -op Vital signs reviewed and stable and Patient moving all extremities  Post vital signs: Reviewed and stable  Last Vitals:  Vitals Value Taken Time  BP 145/93 12/15/2017  9:10 AM  Temp    Pulse 83 12/15/2017  9:12 AM  Resp 12 12/15/2017  9:12 AM  SpO2 100 % 12/15/2017  9:12 AM  Vitals shown include unvalidated device data.  Last Pain:  Vitals:   12/15/17 0641  TempSrc: Oral  PainSc: 4       Patients Stated Pain Goal: 9 (62/[id number]/76 [id number])  Complications: No apparent anesthesia complications

## 2017-12-15 NOTE — Anesthesia Postprocedure Evaluation (Signed)
Anesthesia Post Note  Patient: Chloe Marks  Procedure(s) Performed: HYSTERECTOMY SUPRACERVICAL ABDOMINAL (N/A Abdomen) RIGHT SALPINGO OOPHORECTOMY (N/A Abdomen) LEFT SALPINGECTOMY (N/A Abdomen)  Patient location during evaluation: PACU Anesthesia Type: General Level of consciousness: awake and patient cooperative Pain management: pain level controlled Vital Signs Assessment: post-procedure vital signs reviewed and stable Respiratory status: spontaneous breathing, nonlabored ventilation and respiratory function stable Cardiovascular status: blood pressure returned to baseline Postop Assessment: no apparent nausea or vomiting Anesthetic complications: no     Last Vitals:  Vitals:   12/15/17 0700 12/15/17 0915  BP: 121/85 (!) 146/91  Pulse:  78  Resp: 16 16  Temp:  36.7 C  SpO2: 100% 100%    Last Pain:  Vitals:   12/15/17 0915  TempSrc:   PainSc: Asleep                 Khamron Gellert J

## 2017-12-15 NOTE — Anesthesia Procedure Notes (Signed)
Procedure Name: Intubation Date/Time: 12/15/2017 7:34 AM Performed by: Charmaine Downs, CRNA Pre-anesthesia Checklist: Patient identified, Patient being monitored, Emergency Drugs available and Suction available Patient Re-evaluated:Patient Re-evaluated prior to induction Oxygen Delivery Method: Circle System Utilized Preoxygenation: Pre-oxygenation with 100% oxygen Induction Type: IV induction Ventilation: Mask ventilation without difficulty Laryngoscope Size: Mac and 4 Grade View: Grade I Tube type: Oral Tube size: 7.0 mm Number of attempts: 1 Airway Equipment and Method: stylet Placement Confirmation: ETT inserted through vocal cords under direct vision,  positive ETCO2 and breath sounds checked- equal and bilateral Secured at: 22 cm Tube secured with: Tape Dental Injury: Teeth and Oropharynx as per pre-operative assessment

## 2017-12-15 NOTE — Op Note (Signed)
Preoperative diagnosis:  1.  menometrorrhagia                                          2.  dysmenorrhea                                         3.  Fibroids                                          4.  RLQ pain  Postoperative diagnosis:  Same as above   Procedure:  Abdominal hysterectomy, supracervical, removal of right tube and ovary, removal of left ovary  Surgeon:  Florian Buff  Assistant:    Anesthesia:  General endotracheal  Preoperative clinical summary:   Chloe Marks a 42 y.G.[id number] with No LMP recorded.admitted for abdominal supracervical hysterectomy with removal of right tube and ovary and left tube, preservation of left ovary. Patient has been on megestrol now for about 5 months to control her menses which has helped significantly but not perfectly She has a history of severe pain and heavy periods with clots soils her clothes and sheets. Sonogram reveals small myomas. Additionally she has chronic right lower quadrant, consistently most days, not necessarily associated with the bleeding No dyspareunia    Intraoperative findings: enlarged fibroid uterus, normal tubes and ovaries and peritoneum  Description of operation:  Patient was taken to the operating room and placed in the supine position where she underwent general endotracheal anesthesia.  She was then prepped and draped in the usual sterile fashion and a Foley catheter was placed for continuous bladder drainage.  A Pfannenstiel skin incision was made and carried down sharply to the rectus fascia which was scored in the midline and extended laterally.  The fascia was taken off the muscles superiorly and inferiorly without difficulty.  The muscles were divided.  The peritoneal cavity was entered.  An medium Alexis self-retaining retractor was placed.  The upper abdomen was packed away. Both uterine cornu were grasped with Coker clamps.  The left round ligament was suture ligated and coagulated with the  electrocautery unit.  The left vesicouterine serosal flap was created.  An avascular window in in the peritoneum was created and the utero-ovarian ligament was cross clamped, cut and suture ligated.  The right round ligament was suture ligated and cut with the electrocautery unit.  The vesicouterine serosal flap on the right was created.  An avascular window in the peritoneum was created and the right infundibulo pelvic ligament was cross clamped, cut and double suture ligated.  Thus both the left ovary was preserved and the right ovary was removed.  The uterine vessels were skeletonized bilaterally.  The uterine vessels were clamped bilaterally,  then cut and suture ligated.  Two more pedicles were taken down the cervix medial to the uterine vessels.  Each pedicle was clamped cut and suture ligated with good resulting hemostasis.  As per the preoperative plan the cervix was then transected sharply and the specimen was removed.  The cervical stump was then closed anterior to posterior for hemostasis and reduce postoperative adhesions. The left tube was removed.  The pelvis was irrigated vigorously and all pedicles  were examined and found to be hemostatic.  All specimens were sent to pathology for routine evaluation.  Arista was used.  The Alexis self-retaining retractor was removed   All packs were removed and all counts were correct at this point x 3.  The muscles and peritoneum were reapproximated loosely.  The fascia was closed with 0 Vicryl running.    The skin was closed using 3-0 Vicryl on a Keith needle in a subcuticular fashion.  Dermabond was then applied for additional wound integrity and to serve as a postoperative bacterial barrier.  The patient was awakened from anesthesia taken to the recovery room in good stable condition. All sponge instrument and needle counts were correct x 3.  The patient received Ancef and Toradol prophylactically preoperatively.  Estimated blood loss for the procedure was 150   cc.  Florian Buff  MD 12/15/2017 9:11 AM

## 2017-12-15 NOTE — Anesthesia Preprocedure Evaluation (Signed)
Anesthesia Evaluation  Patient identified by MRN, date of birth, ID band Patient awake    Reviewed: Allergy & Precautions, H&P , NPO status , Patient's Chart, lab work & pertinent test results  Airway Mallampati: II  TM Distance: >3 FB Neck ROM: full    Dental no notable dental hx.    Pulmonary neg pulmonary ROS, former smoker,    Pulmonary exam normal breath sounds clear to auscultation       Cardiovascular Exercise Tolerance: Good hypertension, negative cardio ROS  + Valvular Problems/Murmurs  Rhythm:regular Rate:Normal     Neuro/Psych Anxiety CVA negative neurological ROS  negative psych ROS   GI/Hepatic negative GI ROS, Neg liver ROS,   Endo/Other  negative endocrine ROS  Renal/GU negative Renal ROS  negative genitourinary   Musculoskeletal   Abdominal   Peds  Hematology negative hematology ROS (+) anemia ,   Anesthesia Other Findings   Reproductive/Obstetrics negative OB ROS                             Anesthesia Physical Anesthesia Plan  ASA: II  Anesthesia Plan: General   Post-op Pain Management:    Induction:   PONV Risk Score and Plan:   Airway Management Planned:   Additional Equipment:   Intra-op Plan:   Post-operative Plan:   Informed Consent: I have reviewed the patients History and Physical, chart, labs and discussed the procedure including the risks, benefits and alternatives for the proposed anesthesia with the patient or authorized representative who has indicated his/her understanding and acceptance.     Plan Discussed with: CRNA  Anesthesia Plan Comments:         Anesthesia Quick Evaluation

## 2017-12-16 ENCOUNTER — Encounter (HOSPITAL_COMMUNITY): Payer: Self-pay | Admitting: Obstetrics & Gynecology

## 2017-12-16 LAB — BASIC METABOLIC PANEL
ANION GAP: 5 (ref 5–15)
BUN: 13 mg/dL (ref 6–20)
CO2: 28 mmol/L (ref 22–32)
Calcium: 8.4 mg/dL — ABNORMAL LOW (ref 8.9–10.3)
Chloride: 106 mmol/L (ref 101–111)
Creatinine, Ser: 0.93 mg/dL (ref 0.44–1.00)
GFR calc Af Amer: >60 mL/min (ref >60)
GFR calc non Af Amer: >60 mL/min (ref >60)
GLUCOSE: 110 mg/dL — AB (ref 65–99)
POTASSIUM: 3.3 mmol/L — AB (ref 3.5–5.1)
Sodium: 139 mmol/L (ref 135–145)

## 2017-12-16 LAB — CBC
HEMATOCRIT: 29.7 % — AB (ref 36.0–46.0)
HEMOGLOBIN: 9.2 g/dL — AB (ref 12.0–15.0)
MCH: 22.5 pg — AB (ref 26.0–34.0)
MCHC: 31 g/dL (ref 30.0–36.0)
MCV: 72.6 fL — AB (ref 78.0–100.0)
PLATELETS: 256 10*3/uL (ref 150–400)
RBC: 4.09 MIL/uL (ref 3.87–5.11)
RDW: 16.5 % — ABNORMAL HIGH (ref 11.5–15.5)
WBC: 8.5 10*3/uL (ref 4.0–10.5)

## 2017-12-16 MED ORDER — ONDANSETRON HCL 8 MG PO TABS: 8.0000 mg | ORAL_TABLET | Freq: Four times a day (QID) | ORAL | 0 refills | Status: DC | PRN

## 2017-12-16 MED ORDER — OXYCODONE-ACETAMINOPHEN 7.5-325 MG PO TABS: 1.0000 | ORAL_TABLET | ORAL | 0 refills | Status: DC | PRN

## 2017-12-16 MED ORDER — KETOROLAC TROMETHAMINE 10 MG PO TABS: 10.0000 mg | ORAL_TABLET | Freq: Three times a day (TID) | ORAL | 0 refills | Status: DC | PRN

## 2017-12-16 NOTE — Progress Notes (Signed)
IV removed, WNL. D.C instructions given to pt. Verbalized understanding. Pt spouse at bedside to transport home.  

## 2017-12-16 NOTE — Discharge Instructions (Signed)
Abdominal Hysterectomy, Care After °This sheet gives you information about how to care for yourself after your procedure. Your health care provider may also give you more specific instructions. If you have problems or questions, contact your health care provider. °What can I expect after the procedure? °After your procedure, it is common to have: °· Pain. °· Fatigue. °· Poor appetite. °· Less interest in sex. °· Vaginal bleeding and discharge. You may need to use a sanitary napkin after this procedure. ° °Follow these instructions at home: °Bathing °· Do not take baths, swim, or use a hot tub until your health care provider approves. Ask your health care provider if you can take showers. You may only be allowed to take sponge baths for bathing. °· Keep the bandage (dressing) dry until your health care provider says it can be removed. °Incision care °· Follow instructions from your health care provider about how to take care of your incision. Make sure you: °? Wash your hands with soap and water before you change your bandage (dressing). If soap and water are not available, use hand sanitizer. °? Change your dressing as told by your health care provider. °? Leave stitches (sutures), skin glue, or adhesive strips in place. These skin closures may need to stay in place for 2 weeks or longer. If adhesive strip edges start to loosen and curl up, you may trim the loose edges. Do not remove adhesive strips completely unless your health care provider tells you to do that. °· Check your incision area every day for signs of infection. Check for: °? Redness, swelling, or pain. °? Fluid or blood. °? Warmth. °? Pus or a bad smell. °Activity °· Do gentle, daily exercises as told by your health care provider. You may be told to take short walks every day and go farther each time. °· Do not lift anything that is heavier than 10 lb (4.5 kg), or the limit that your health care provider tells you, until he or she says that it is  safe. °· Do not drive or use heavy machinery while taking prescription pain medicine. °· Do not drive for 24 hours if you were given a medicine to help you relax (sedative). °· Follow your health care provider's instructions about exercise, driving, and general activities. Ask your health care provider what activities are safe for you. °Lifestyle °· Do not douche, use tampons, or have sex for at least 6 weeks or as told by your health care provider. °· Do not drink alcohol until your health care provider approves. °· Drink enough fluid to keep your urine clear or pale yellow. °· Try to have someone at home with you for the first 1-2 weeks to help. °· Do not use any products that contain nicotine or tobacco, such as cigarettes and e-cigarettes. These can delay healing. If you need help quitting, ask your health care provider. °General instructions °· Take over-the-counter and prescription medicines only as told by your health care provider. °· Do not take aspirin or ibuprofen. These medicines can cause bleeding. °· To prevent or treat constipation while you are taking prescription pain medicine, your health care provider may recommend that you: °? Drink enough fluid to keep your urine clear or pale yellow. °? Take over-the-counter or prescription medicines. °? Eat foods that are high in fiber, such as fresh fruits and vegetables, whole grains, and beans. °? Limit foods that are high in fat and processed sugars, such as fried and sweet foods. °· Keep all   follow-up visits as told by your health care provider. This is important. °Contact a health care provider if: °· You have chills or fever. °· You have redness, swelling, or pain around your incision. °· You have fluid or blood coming from your incision. °· Your incision feels warm to the touch. °· You have pus or a bad smell coming from your incision. °· Your incision breaks open. °· You feel dizzy or light-headed. °· You have pain or bleeding when you urinate. °· You  have persistent diarrhea. °· You have persistent nausea and vomiting. °· You have abnormal vaginal discharge. °· You have a rash. °· You have any type of abnormal reaction or you develop an allergy to your medicine. °· Your pain medicine does not help. °Get help right away if: °· You have a fever and your symptoms suddenly get worse. °· You have severe abdominal pain. °· You have shortness of breath. °· You faint. °· You have pain, swelling, or redness in your leg. °· You have heavy vaginal bleeding with blood clots. °Summary °· After your procedure, it is common to have pain, fatigue and vaginal discharge. °· Do not take baths, swim, or use a hot tub until your health care provider approves. Ask your health care provider if you can take showers. You may only be allowed to take sponge baths for bathing. °· Follow your health care provider's instructions about exercise, driving, and general activities. Ask your health care provider what activities are safe for you. °· Do not lift anything that is heavier than 10 lb (4.5 kg), or the limit that your health care provider tells you, until he or she says that it is safe. °· Try to have someone at home with you for the first 1-2 weeks to help. °This information is not intended to replace advice given to you by your health care provider. Make sure you discuss any questions you have with your health care provider. °Document Released: 01/16/2005 Document Revised: 06/17/2016 Document Reviewed: 06/17/2016 °Elsevier Interactive Patient Education © 2017 [employer] ° °

## 2017-12-16 NOTE — Discharge Summary (Signed)
Physician Discharge Summary  Patient ID: Chloe Marks MRN: [id number] DOB/AGE: [birth year] 42 y.o.  Admit date: 12/15/2017 Discharge date: 12/16/2017  Admission Diagnoses: Fibroids menorrhagia dysmenorrhea RLQ pain Discharge Diagnoses:  Active Problems:   S/P hysterectomy   Discharged Condition: good  Hospital Course: unremarkable post op course  Consults: None  Significant Diagnostic Studies: labs:  Results for orders placed or performed during the hospital encounter of 12/15/17 (from the past 168 hour(s))  CBC   Collection Time: 12/16/17  4:36 AM  Result Value Ref Range   WBC 8.5 4.0 - 10.5 K/uL   RBC 4.09 3.87 - 5.11 MIL/uL   Hemoglobin 9.2 (L) 12.0 - 15.0 g/dL   HCT 29.7 (L) 36.0 - 46.0 %   MCV 72.6 (L) 78.0 - 100.0 fL   MCH 22.5 (L) 26.0 - 34.0 pg   MCHC 31.0 30.0 - 36.0 g/dL   RDW 16.5 (H) 11.5 - 15.5 %   Platelets 256 150 - 400 K/uL  Basic metabolic panel   Collection Time: 12/16/17  4:36 AM  Result Value Ref Range   Sodium 139 135 - 145 mmol/L   Potassium 3.3 (L) 3.5 - 5.1 mmol/L   Chloride 106 101 - 111 mmol/L   CO2 28 22 - 32 mmol/L   Glucose, Bld 110 (H) 65 - 99 mg/dL   BUN 13 6 - 20 mg/dL   Creatinine, Ser 0.93 0.44 - 1.00 mg/dL   Calcium 8.4 (L) 8.9 - 10.3 mg/dL   GFR calc non Af Amer >60 >60 mL/min   GFR calc Af Amer >60 >60 mL/min   Anion gap 5 5 - 15  Results for orders placed or performed during the hospital encounter of 12/10/17 (from the past 168 hour(s))  Glucose, capillary   Collection Time: 12/10/17  2:10 PM  Result Value Ref Range   Glucose-Capillary 94 65 - 99 mg/dL  Hemoglobin A1c   Collection Time: 12/10/17  2:13 PM  Result Value Ref Range   Hgb A1c MFr Bld 5.7 (H) 4.8 - 5.6 %   Mean Plasma Glucose 116.89 mg/dL  CBC   Collection Time: 12/10/17  2:13 PM  Result Value Ref Range   WBC 6.5 4.0 - 10.5 K/uL   RBC 4.99 3.87 - 5.11 MIL/uL   Hemoglobin 11.1 (L) 12.0 - 15.0 g/dL   HCT 35.8 (L) 36.0 - 46.0 %   MCV 71.7 (L) 78.0 - 100.0  fL   MCH 22.2 (L) 26.0 - 34.0 pg   MCHC 31.0 30.0 - 36.0 g/dL   RDW 16.6 (H) 11.5 - 15.5 %   Platelets 301 150 - 400 K/uL  Comprehensive metabolic panel   Collection Time: 12/10/17  2:13 PM  Result Value Ref Range   Sodium 139 135 - 145 mmol/L   Potassium 3.3 (L) 3.5 - 5.1 mmol/L   Chloride 104 101 - 111 mmol/L   CO2 24 22 - 32 mmol/L   Glucose, Bld 90 65 - 99 mg/dL   BUN 16 6 - 20 mg/dL   Creatinine, Ser 1.03 (H) 0.44 - 1.00 mg/dL   Calcium 9.5 8.9 - 10.3 mg/dL   Total Protein 8.4 (H) 6.5 - 8.1 g/dL   Albumin 4.6 3.5 - 5.0 g/dL   AST 43 (H) 15 - 41 U/L   ALT 20 14 - 54 U/L   Alkaline Phosphatase 48 38 - 126 U/L   Total Bilirubin 0.5 0.3 - 1.2 mg/dL   GFR calc non Af Amer >60 >60 mL/min  GFR calc Af Amer >60 >60 mL/min   Anion gap 11 5 - 15  hCG, quantitative, pregnancy   Collection Time: 12/10/17  2:13 PM  Result Value Ref Range   hCG, Beta Chain, Quant, S <1 <5 mIU/mL  Rapid HIV screen (HIV 1/2 Ab+Ag)   Collection Time: 12/10/17  2:13 PM  Result Value Ref Range   HIV-1 P24 Antigen - HIV24 NON REACTIVE NON REACTIVE   HIV 1/2 Antibodies NON REACTIVE NON REACTIVE   Interpretation (HIV Ag Ab)      A non reactive test result means that HIV 1 or HIV 2 antibodies and HIV 1 p24 antigen were not detected in the specimen.  Urinalysis, Routine w reflex microscopic   Collection Time: 12/10/17  2:13 PM  Result Value Ref Range   Color, Urine YELLOW YELLOW   APPearance HAZY (A) CLEAR   Specific Gravity, Urine 1.025 1.005 - 1.030   pH 5.0 5.0 - 8.0   Glucose, UA NEGATIVE NEGATIVE mg/dL   Hgb urine dipstick LARGE (A) NEGATIVE   Bilirubin Urine NEGATIVE NEGATIVE   Ketones, ur NEGATIVE NEGATIVE mg/dL   Protein, ur 30 (A) NEGATIVE mg/dL   Nitrite NEGATIVE NEGATIVE   Leukocytes, UA SMALL (A) NEGATIVE   RBC / HPF >50 (H) 0 - 5 RBC/hpf   WBC, UA 11-20 0 - 5 WBC/hpf   Bacteria, UA RARE (A) NONE SEEN   Squamous Epithelial / LPF 11-20 0 - 5   Mucus PRESENT   Type and screen    Collection Time: 12/10/17  2:13 PM  Result Value Ref Range   ABO/RH(D) O POS    Antibody Screen NEG    Sample Expiration 12/24/2017    Extend sample reason      NO TRANSFUSIONS OR PREGNANCY IN THE PAST 3 MONTHS Performed at [facility], [address], [place], [place] [address]        Treatments: surgery: Abdominal supracervical hysterectomy RSO left salpingectomy  Discharge Exam: Blood pressure 121/88, pulse 73, temperature 98.1 F (36.7 C), temperature source Oral, resp. rate 18, last menstrual period 06/01/2017, SpO2 99 %. General appearance: alert, cooperative and no distress GI: soft, non-tender; bowel sounds normal; no masses,  no organomegaly Incision/Wound:dressing is dry clean intact  Disposition: Discharge disposition: 01-Home or Self Care       Discharge Instructions    Call MD for:  persistant nausea and vomiting   Complete by:  As directed    Call MD for:  severe uncontrolled pain   Complete by:  As directed    Call MD for:  temperature >100.4   Complete by:  As directed    Diet - low sodium heart healthy   Complete by:  As directed    Driving Restrictions   Complete by:  As directed    No driving for 1 week   Increase activity slowly   Complete by:  As directed    Leave dressing on - Keep it clean, dry, and intact until clinic visit   Complete by:  As directed    Lifting restrictions   Complete by:  As directed    Do not lift more than 10 pounds   Sexual Activity Restrictions   Complete by:  As directed    No sex for 6 weeks     Allergies as of 12/16/2017      Reactions   Vicodin [hydrocodone-acetaminophen] Nausea And Vomiting, Other (See Comments)   dizziness      Medication List  STOP taking these medications   ibuprofen 800 MG tablet Commonly known as:  ADVIL,MOTRIN   megestrol 40 MG tablet Commonly known as:  MEGACE     TAKE these medications   aspirin EC 81 MG tablet Take 81 mg by mouth daily.   cetirizine 10 MG  tablet Commonly known as:  ZYRTEC Take 10 mg by mouth daily.   docusate sodium 100 MG capsule Commonly known as:  COLACE Take 100 mg by mouth daily.   escitalopram 10 MG tablet Commonly known as:  LEXAPRO Take 10 mg by mouth daily.   fluticasone 50 MCG/ACT nasal spray Commonly known as:  FLONASE Place 2 sprays into both nostrils daily. What changed:    when to take this  reasons to take this   iron polysaccharides 150 MG capsule Commonly known as:  NIFEREX Take 150 mg by mouth daily.   ketorolac 10 MG tablet Commonly known as:  TORADOL Take 1 tablet (10 mg total) by mouth every 8 (eight) hours as needed.   lisinopril-hydrochlorothiazide 10-12.5 MG tablet Commonly known as:  PRINZIDE,ZESTORETIC Take 1 tablet by mouth daily.   ondansetron 8 MG disintegrating tablet Commonly known as:  ZOFRAN ODT Take 1 tablet (8 mg total) by mouth every 8 (eight) hours as needed for nausea or vomiting.   ondansetron 8 MG tablet Commonly known as:  ZOFRAN Take 1 tablet (8 mg total) by mouth every 6 (six) hours as needed for nausea.   oxyCODONE-acetaminophen 7.5-325 MG tablet Commonly known as:  PERCOCET Take 1-2 tablets by mouth every 4 (four) hours as needed for moderate pain.      Follow-up Information    Florian Buff, MD Follow up on 12/23/2017.   Specialties:  Obstetrics and Gynecology, Radiology Why:  post op visit Contact information: [place] [address] [phone]           Signed: Florian Buff 12/16/2017, 4:47 PM

## 2017-12-23 ENCOUNTER — Ambulatory Visit (INDEPENDENT_AMBULATORY_CARE_PROVIDER_SITE_OTHER): Payer: Commercial Managed Care - PPO | Admitting: Obstetrics & Gynecology

## 2017-12-23 ENCOUNTER — Encounter: Payer: Self-pay | Admitting: Obstetrics & Gynecology

## 2017-12-23 VITALS — BP 124/87 | HR 68 | Ht 67.0 in | Wt 210.0 lb

## 2017-12-23 DIAGNOSIS — Z9889 Other specified postprocedural states: Secondary | ICD-10-CM

## 2017-12-23 DIAGNOSIS — Z90710 Acquired absence of both cervix and uterus: Secondary | ICD-10-CM

## 2017-12-23 MED ORDER — KETOROLAC TROMETHAMINE 10 MG PO TABS: 10.0000 mg | ORAL_TABLET | Freq: Three times a day (TID) | ORAL | 0 refills | Status: DC | PRN

## 2017-12-23 MED ORDER — OXYCODONE-ACETAMINOPHEN 5-325 MG PO TABS: 1.0000 | ORAL_TABLET | ORAL | 0 refills | Status: DC | PRN

## 2017-12-23 NOTE — Progress Notes (Signed)
  HPI: Patient returns for routine postoperative follow-up having undergone supracervical abdominal hysterectomy RSO left salpingectomy on 12/15/2017.  The patient's immediate postoperative recovery has been unremarkable. Since hospital discharge the patient reports incision pulling bladdr working ok BM x 2.   Current Outpatient Medications: aspirin EC 81 MG tablet, Take 81 mg by mouth daily., Disp: , Rfl:  cetirizine (ZYRTEC) 10 MG tablet, Take 10 mg by mouth daily., Disp: , Rfl:  docusate sodium (COLACE) 100 MG capsule, Take 100 mg by mouth daily., Disp: , Rfl:  escitalopram (LEXAPRO) 10 MG tablet, Take 10 mg by mouth daily., Disp: , Rfl:  fluticasone (FLONASE) 50 MCG/ACT nasal spray, Place 2 sprays into both nostrils daily. (Patient taking differently: Place 2 sprays into both nostrils daily as needed for allergies. ), Disp: 9.9 g, Rfl: 0 iron polysaccharides (NIFEREX) 150 MG capsule, Take 150 mg by mouth daily., Disp: , Rfl:  ketorolac (TORADOL) 10 MG tablet, Take 1 tablet (10 mg total) by mouth every 8 (eight) hours as needed., Disp: 15 tablet, Rfl: 0 lisinopril-hydrochlorothiazide (PRINZIDE,ZESTORETIC) 10-12.5 MG per tablet, Take 1 tablet by mouth daily.  , Disp: , Rfl:  ondansetron (ZOFRAN) 8 MG tablet, Take 1 tablet (8 mg total) by mouth every 6 (six) hours as needed for nausea., Disp: 20 tablet, Rfl: 0 oxyCODONE-acetaminophen (PERCOCET) 7.5-325 MG tablet, Take 1-2 tablets by mouth every 4 (four) hours as needed for moderate pain., Disp: 30 tablet, Rfl: 0 ondansetron (ZOFRAN ODT) 8 MG disintegrating tablet, Take 1 tablet (8 mg total) by mouth every 8 (eight) hours as needed for nausea or vomiting. (Patient not taking: Reported on 12/23/2017), Disp: 20 tablet, Rfl: 0  No current facility-administered medications for this visit.     Blood pressure 124/87, pulse 68, height 5\' 7"  (1.702 m), weight 210 lb (95.3 kg), last menstrual period 06/01/2017.  Physical Exam: Incision clean dry  intact Abdominal exam is normal  Diagnostic Tests:   Pathology: benign  Impression: S/p Supracervical hysterctomy with RSO and removal of left tube, preserved the left ovary  Plan: Meds ordered this encounter  Medications  . oxyCODONE-acetaminophen (PERCOCET/ROXICET) 5-325 MG tablet    Sig: Take 1 tablet by mouth every 4 (four) hours as needed for severe pain.    Dispense:  20 tablet    Refill:  0  . ketorolac (TORADOL) 10 MG tablet    Sig: Take 1 tablet (10 mg total) by mouth every 8 (eight) hours as needed.    Dispense:  15 tablet    Refill:  0     Follow up: 4  weeks  Florian Buff, MD

## 2018-01-20 ENCOUNTER — Ambulatory Visit (INDEPENDENT_AMBULATORY_CARE_PROVIDER_SITE_OTHER): Payer: Commercial Managed Care - PPO | Admitting: Obstetrics & Gynecology

## 2018-01-20 ENCOUNTER — Encounter: Payer: Self-pay | Admitting: *Deleted

## 2018-01-20 ENCOUNTER — Telehealth: Payer: Self-pay | Admitting: Obstetrics & Gynecology

## 2018-01-20 ENCOUNTER — Encounter: Payer: Self-pay | Admitting: Obstetrics & Gynecology

## 2018-01-20 VITALS — BP 132/94 | HR 66 | Ht 67.0 in | Wt 215.0 lb

## 2018-01-20 DIAGNOSIS — R300 Dysuria: Secondary | ICD-10-CM | POA: Diagnosis not present

## 2018-01-20 DIAGNOSIS — Z90710 Acquired absence of both cervix and uterus: Secondary | ICD-10-CM

## 2018-01-20 LAB — POCT URINALYSIS DIPSTICK
Blood, UA: NEGATIVE
Glucose, UA: NEGATIVE
LEUKOCYTES UA: NEGATIVE
Nitrite, UA: NEGATIVE
PROTEIN UA: POSITIVE — AB

## 2018-01-20 MED ORDER — KETOROLAC TROMETHAMINE 10 MG PO TABS: 10.0000 mg | ORAL_TABLET | Freq: Three times a day (TID) | ORAL | 0 refills | Status: DC | PRN

## 2018-01-20 NOTE — Telephone Encounter (Signed)
PT WAS HER AND EURE CLEARED HER TO GO BACK TO WORK SHE IS GOING BACK ON 01-27-2018/ NEEDS NOTE FROM DR. PLEASE CALL AND LET HER KNOW WHEN READY TO PICK UP

## 2018-01-20 NOTE — Progress Notes (Signed)
  HPI: Patient returns for routine postoperative follow-up having undergone supracervical hysterectomy with RSO on 12/15/2017.  The patient's immediate postoperative recovery has been unremarkable. Since hospital discharge the patient reports no problems.   Current Outpatient Medications: aspirin EC 81 MG tablet, Take 81 mg by mouth daily., Disp: , Rfl:  cetirizine (ZYRTEC) 10 MG tablet, Take 10 mg by mouth daily., Disp: , Rfl:  docusate sodium (COLACE) 100 MG capsule, Take 100 mg by mouth daily., Disp: , Rfl:  escitalopram (LEXAPRO) 10 MG tablet, Take 10 mg by mouth daily., Disp: , Rfl:  fluticasone (FLONASE) 50 MCG/ACT nasal spray, Place 2 sprays into both nostrils daily. (Patient taking differently: Place 2 sprays into both nostrils daily as needed for allergies. ), Disp: 9.9 g, Rfl: 0 iron polysaccharides (NIFEREX) 150 MG capsule, Take 150 mg by mouth daily., Disp: , Rfl:  ketorolac (TORADOL) 10 MG tablet, Take 1 tablet (10 mg total) by mouth every 8 (eight) hours as needed., Disp: 15 tablet, Rfl: 0 lisinopril-hydrochlorothiazide (PRINZIDE,ZESTORETIC) 10-12.5 MG per tablet, Take 1 tablet by mouth daily.  , Disp: , Rfl:  ondansetron (ZOFRAN ODT) 8 MG disintegrating tablet, Take 1 tablet (8 mg total) by mouth every 8 (eight) hours as needed for nausea or vomiting., Disp: 20 tablet, Rfl: 0 ondansetron (ZOFRAN) 8 MG tablet, Take 1 tablet (8 mg total) by mouth every 6 (six) hours as needed for nausea. (Patient not taking: Reported on 01/20/2018), Disp: 20 tablet, Rfl: 0 oxyCODONE-acetaminophen (PERCOCET) 7.5-325 MG tablet, Take 1-2 tablets by mouth every 4 (four) hours as needed for moderate pain. (Patient not taking: Reported on 01/20/2018), Disp: 30 tablet, Rfl: 0 oxyCODONE-acetaminophen (PERCOCET/ROXICET) 5-325 MG tablet, Take 1 tablet by mouth every 4 (four) hours as needed for severe pain. (Patient not taking: Reported on 01/20/2018), Disp: 20 tablet, Rfl: 0  No current facility-administered  medications for this visit.     Blood pressure (!) 132/94, pulse 66, height 5\' 7"  (1.702 m), weight 215 lb (97.5 kg), last menstrual period 06/01/2017.  Physical Exam: Incision clean dry intact  Diagnostic Tests:   Pathology: benign  Impression: S/p supracervical abdominal hysterectomy with removal of right tube and ovary and left tube  The left ovary is preserved  Plan:   Follow up: 1  years  Florian Buff, MD

## 2018-01-24 ENCOUNTER — Emergency Department (HOSPITAL_COMMUNITY)
Admission: EM | Admit: 2018-01-24 | Discharge: 2018-01-24 | Disposition: A | Discharge status: Home / Self Care | Payer: Commercial Managed Care - PPO | Attending: Emergency Medicine | Admitting: Emergency Medicine

## 2018-01-24 ENCOUNTER — Other Ambulatory Visit: Payer: Self-pay

## 2018-01-24 ENCOUNTER — Encounter (HOSPITAL_COMMUNITY): Payer: Self-pay | Admitting: Emergency Medicine

## 2018-01-24 DIAGNOSIS — Z79899 Other long term (current) drug therapy: Secondary | ICD-10-CM | POA: Insufficient documentation

## 2018-01-24 DIAGNOSIS — M545 Low back pain: Secondary | ICD-10-CM | POA: Diagnosis present

## 2018-01-24 DIAGNOSIS — M25551 Pain in right hip: Secondary | ICD-10-CM | POA: Diagnosis not present

## 2018-01-24 DIAGNOSIS — Z87891 Personal history of nicotine dependence: Secondary | ICD-10-CM | POA: Insufficient documentation

## 2018-01-24 DIAGNOSIS — I10 Essential (primary) hypertension: Secondary | ICD-10-CM | POA: Insufficient documentation

## 2018-01-24 DIAGNOSIS — M62830 Muscle spasm of back: Secondary | ICD-10-CM | POA: Insufficient documentation

## 2018-01-24 DIAGNOSIS — Z8673 Personal history of transient ischemic attack (TIA), and cerebral infarction without residual deficits: Secondary | ICD-10-CM | POA: Insufficient documentation

## 2018-01-24 DIAGNOSIS — Z7982 Long term (current) use of aspirin: Secondary | ICD-10-CM | POA: Diagnosis not present

## 2018-01-24 MED ORDER — ONDANSETRON HCL 4 MG PO TABS
4.0000 mg | ORAL_TABLET | Freq: Once | ORAL | Status: AC
  Administered 2018-01-24: 4 mg via ORAL
  Filled 2018-01-24: qty 1

## 2018-01-24 MED ORDER — KETOROLAC TROMETHAMINE 10 MG PO TABS
10.0000 mg | ORAL_TABLET | Freq: Once | ORAL | Status: AC
  Administered 2018-01-24: 10 mg via ORAL
  Filled 2018-01-24: qty 1

## 2018-01-24 MED ORDER — OXYCODONE-ACETAMINOPHEN 5-325 MG PO TABS
1.0000 | ORAL_TABLET | Freq: Once | ORAL | Status: AC
  Administered 2018-01-24: 1 via ORAL
  Filled 2018-01-24: qty 1

## 2018-01-24 MED ORDER — CYCLOBENZAPRINE HCL 10 MG PO TABS: 10.0000 mg | ORAL_TABLET | Freq: Three times a day (TID) | ORAL | 0 refills | Status: DC

## 2018-01-24 MED ORDER — TRAMADOL HCL 50 MG PO TABS: 50.0000 mg | ORAL_TABLET | Freq: Four times a day (QID) | ORAL | 0 refills | Status: DC | PRN

## 2018-01-24 MED ORDER — DIAZEPAM 5 MG PO TABS
10.0000 mg | ORAL_TABLET | Freq: Once | ORAL | Status: AC
  Administered 2018-01-24: 10 mg via ORAL
  Filled 2018-01-24: qty 2

## 2018-01-24 NOTE — ED Notes (Signed)
Pt reports has had some mild r lower back pain for the past week.  Pt says had been doing some stretches to help her back.  This morning, pt did her stretches and rode an exercise bike without difficulty.    Pt says later she was wiping out her microwave and had sudden spasm in r lower back radiating around to r hip.

## 2018-01-24 NOTE — ED Triage Notes (Signed)
Pt c/o of right hip pain x 1 week radiating down her left leg.   Denies any falls or new injuries.

## 2018-01-24 NOTE — ED Provider Notes (Signed)
[facility] EMERGENCY DEPARTMENT Provider Note   CSN: [id number] Arrival date & time: 01/24/18  1408     History   Chief Complaint Chief Complaint  Patient presents with  . Hip Pain    HPI Chloe Marks is a 42 y.o. female.  Patient is a 42 year old female who presents to the emergency department with a complaint of back pain and hip pain.  The patient states that over the last 4 days she has been having some mild right lower back pain.  She has been doing stretches and applying conservative measures to assist with her pain.  Earlier today she was wiping on a microwave oven, when she had a sudden spasm in her right lower back radiating to the hip area.  She did not lose control of bowels or bladder.  She is ambulatory, but with some pain and discomfort.  No recent operations or procedures on the lower back.  No other injuries reported recently.  There is been no changes in sensation in the saddle area, or the lower extremities.  Patient presents now for assistance with this lower back pain.     Past Medical History:  Diagnosis Date  . Anemia   . Anxiety   . Family history of adverse reaction to anesthesia    PONV  . Fibroids   . Heart murmur   . Hypertension   . Ovarian cyst   . Pre-diabetes   . 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 S/P hysterectomy 12/15/2017  . Closed head injury without loss of consciousness 06/11/2011    Past Surgical History:  Procedure Laterality Date  . CHOLECYSTECTOMY    . DILITATION & CURRETTAGE/HYSTROSCOPY WITH NOVASURE ABLATION N/A 03/24/2017   Procedure: DILATATION & CURETTAGE/HYSTEROSCOPY WITH NOVASURE ABLATION;  Surgeon: Florian Buff, MD;  Location: [facility];  Service: Gynecology;  Laterality: N/A;  . SALPINGOOPHORECTOMY N/A 12/15/2017   Procedure: RIGHT SALPINGO OOPHORECTOMY;  Surgeon: Florian Buff, MD;  Location: [facility];   Service: Gynecology;  Laterality: N/A;  . SUPRACERVICAL ABDOMINAL HYSTERECTOMY N/A 12/15/2017   Procedure: HYSTERECTOMY SUPRACERVICAL ABDOMINAL;  Surgeon: Florian Buff, MD;  Location: [facility];  Service: Gynecology;  Laterality: N/A;  . TUBAL LIGATION    . UNILATERAL SALPINGECTOMY N/A 12/15/2017   Procedure: LEFT SALPINGECTOMY;  Surgeon: Florian Buff, MD;  Location: [facility];  Service: Gynecology;  Laterality: N/A;     OB History    Gravida  3   Para  2   Term  2   Preterm      AB  1   Living  2     SAB  1   TAB      Ectopic      Multiple      Live Births  2            Home Medications    Prior to Admission medications   Medication Sig Start Date End Date Taking? Authorizing Provider  aspirin EC 81 MG tablet Take 81 mg by mouth daily.    [provider]  cetirizine (ZYRTEC) 10 MG tablet Take 10 mg by mouth daily.    [provider]  docusate sodium (COLACE) 100 MG capsule Take 100 mg by mouth daily.    [provider]  escitalopram (LEXAPRO) 10 MG tablet Take 10 mg by mouth daily.  [provider]  fluticasone (FLONASE) 50 MCG/ACT nasal spray Place 2 sprays into both nostrils daily. Patient taking differently: Place 2 sprays into both nostrils daily as needed for allergies.  07/13/17   Recardo Evangelist, PA-C  iron polysaccharides (NIFEREX) 150 MG capsule Take 150 mg by mouth daily.    [provider]  ketorolac (TORADOL) 10 MG tablet Take 1 tablet (10 mg total) by mouth every 8 (eight) hours as needed. 12/23/17   Florian Buff, MD  ketorolac (TORADOL) 10 MG tablet Take 1 tablet (10 mg total) by mouth every 8 (eight) hours as needed. 01/20/18   Florian Buff, MD  lisinopril-hydrochlorothiazide (PRINZIDE,ZESTORETIC) 10-12.5 MG per tablet Take 1 tablet by mouth daily.      [provider]  ondansetron (ZOFRAN ODT) 8 MG disintegrating tablet Take 1 tablet (8 mg total) by mouth every 8 (eight) hours as needed for  nausea or vomiting. 03/24/17   Florian Buff, MD  ondansetron (ZOFRAN) 8 MG tablet Take 1 tablet (8 mg total) by mouth every 6 (six) hours as needed for nausea. Patient not taking: Reported on 01/20/2018 12/16/17   Florian Buff, MD  oxyCODONE-acetaminophen (PERCOCET) 7.5-325 MG tablet Take 1-2 tablets by mouth every 4 (four) hours as needed for moderate pain. Patient not taking: Reported on 01/20/2018 12/16/17   Florian Buff, MD  oxyCODONE-acetaminophen (PERCOCET/ROXICET) 5-325 MG tablet Take 1 tablet by mouth every 4 (four) hours as needed for severe pain. Patient not taking: Reported on 01/20/2018 12/23/17   Florian Buff, MD    Family History Family History  Problem Relation Age of Onset  . Hypertension Mother   . Non-Hodgkin's lymphoma Sister   . Other Paternal Grandmother        heart issues  . Hypertension Brother   . Diabetes Brother   . Stroke Brother   . Fibroids Sister     Social History Social History   Tobacco Use  . Smoking status: Former Smoker    Packs/day: 0.25    Years: 4.00    Pack years: 1.00    Types: Cigarettes, Cigars    Last attempt to quit: 03/22/2014    Years since quitting: 3.8  . Smokeless tobacco: Never Used  Substance Use Topics  . Alcohol use: No  . Drug use: No     Allergies   Vicodin [hydrocodone-acetaminophen]   Review of Systems Review of Systems  Constitutional: Negative for activity change.       All ROS Neg except as noted in HPI  HENT: Negative for nosebleeds.   Eyes: Negative for photophobia and discharge.  Respiratory: Negative for cough, shortness of breath and wheezing.   Cardiovascular: Negative for chest pain and palpitations.  Gastrointestinal: Negative for abdominal pain and blood in stool.  Genitourinary: Negative for dysuria, frequency and hematuria.  Musculoskeletal: Positive for arthralgias and back pain. Negative for neck pain.  Skin: Negative.   Neurological: Negative for dizziness, seizures and speech difficulty.   Psychiatric/Behavioral: Negative for confusion and hallucinations.     Physical Exam Updated Vital Signs BP 131/84 (BP Location: Left Arm)   Pulse 93   Temp 98.7 F (37.1 C) (Oral)   Resp 16   Ht 5\' 7"  (1.702 m)   Wt 97.5 kg (215 lb)   LMP 06/01/2017   SpO2 99%   BMI 33.67 kg/m   Physical Exam  Constitutional: She is oriented to person, place, and time. She appears well-developed and well-nourished.  Non-toxic appearance.  HENT:  Head: Normocephalic.  Right Ear: Tympanic membrane and external ear normal.  Left Ear: Tympanic membrane and external ear normal.  Eyes: Pupils are equal, round, and reactive to light. EOM and lids are normal.  Neck: Normal range of motion. Neck supple. Carotid bruit is not present.  Cardiovascular: Normal rate, regular rhythm, normal heart sounds, intact distal pulses and normal pulses.  Pulmonary/Chest: Breath sounds normal. No respiratory distress.  Abdominal: Soft. Bowel sounds are normal. There is no tenderness. There is no guarding.  Musculoskeletal: Normal range of motion.       Thoracic back: Normal.       Lumbar back: She exhibits pain and spasm.       Back:  Lymphadenopathy:       Head (right side): No submandibular adenopathy present.       Head (left side): No submandibular adenopathy present.    She has no cervical adenopathy.  Neurological: She is alert and oriented to person, place, and time. She has normal strength. No cranial nerve deficit or sensory deficit.  Skin: Skin is warm and dry.  Psychiatric: She has a normal mood and affect. Her speech is normal.  Nursing note and vitals reviewed.    ED Treatments / Results  Labs (all labs ordered are listed, but only abnormal results are displayed) Labs Reviewed - No data to display  EKG None  Radiology No results found.  Procedures Procedures (including critical care time)  Medications Ordered in ED Medications - No data to display   Initial Impression / Assessment  and Plan / ED Course  I have reviewed the triage vital signs and the nursing notes.  Pertinent labs & imaging results that were available during my care of the patient were reviewed by me and considered in my medical decision making (see chart for details).      Final Clinical Impressions(s) / ED Diagnoses Vital signs within normal limits.  Pulse oximetry is 99% on room air.  Within normal limits by my interpretation.  Examination suggests spasm pain of the lower back extending into the right buttocks and hip area.  No neurovascular deficits appreciated.  No evidence of cauda equina or other emergent changes.  3:59pm Pt states she is not feel any better. She has pain with movement, unchanged from admission.  Recheck.  5:10 PM patient states that she feels better.  She is now able to sit up without having so much spasm pain.  No nerve changes in examination other than pain improved.  Patient will be treated with Flexeril 3 times daily.  I have asked the patient to restart her Toradol.  10 tablets of Ultram every 6 hours has been given to the patient.   Final diagnoses:  Muscle spasm of back  Pain of right hip joint    ED Discharge Orders        Ordered    cyclobenzaprine (FLEXERIL) 10 MG tablet  3 times daily     01/24/18 1710    traMADol (ULTRAM) 50 MG tablet  Every 6 hours PRN     01/24/18 1710       Lily Kocher, PA-C 01/24/18 1719    Chloe Manifold, MD 01/31/18 2154

## 2018-01-24 NOTE — Discharge Instructions (Addendum)
Your vital signs within normal limits.  Your examination suggests muscle spasm and right hip pain.  Please use a heating pad to your back and hip area.  Please rest your back and hip.  Continue your Toradol as scheduled.  Please add Flexeril to the medication schedule.  Use Ultram every 6 hours if needed for more severe pain.  Flexeril and Ultram may cause drowsiness, and/or lightheadedness.  Please do not drive a vehicle, operate machinery, drink alcohol, or participate in activities requiring concentration when taking this medication. See Dr Perlie Mayo for additional evaluation and management if not improving.

## 2019-04-22 ENCOUNTER — Other Ambulatory Visit: Payer: Self-pay

## 2019-04-22 ENCOUNTER — Emergency Department (HOSPITAL_COMMUNITY): Payer: PRIVATE HEALTH INSURANCE

## 2019-04-22 ENCOUNTER — Encounter (HOSPITAL_COMMUNITY): Payer: Self-pay | Admitting: Emergency Medicine

## 2019-04-22 ENCOUNTER — Emergency Department (HOSPITAL_COMMUNITY)
Admission: EM | Admit: 2019-04-22 | Discharge: 2019-04-22 | Disposition: A | Discharge status: Home / Self Care | Payer: PRIVATE HEALTH INSURANCE | Attending: Emergency Medicine | Admitting: Emergency Medicine

## 2019-04-22 DIAGNOSIS — Z7982 Long term (current) use of aspirin: Secondary | ICD-10-CM | POA: Insufficient documentation

## 2019-04-22 DIAGNOSIS — Z87891 Personal history of nicotine dependence: Secondary | ICD-10-CM | POA: Insufficient documentation

## 2019-04-22 DIAGNOSIS — S00531A Contusion of lip, initial encounter: Secondary | ICD-10-CM

## 2019-04-22 DIAGNOSIS — W541XXA Struck by dog, initial encounter: Secondary | ICD-10-CM | POA: Diagnosis not present

## 2019-04-22 DIAGNOSIS — Z8673 Personal history of transient ischemic attack (TIA), and cerebral infarction without residual deficits: Secondary | ICD-10-CM | POA: Insufficient documentation

## 2019-04-22 DIAGNOSIS — I10 Essential (primary) hypertension: Secondary | ICD-10-CM | POA: Diagnosis not present

## 2019-04-22 DIAGNOSIS — Y9389 Activity, other specified: Secondary | ICD-10-CM | POA: Insufficient documentation

## 2019-04-22 DIAGNOSIS — Y92019 Unspecified place in single-family (private) house as the place of occurrence of the external cause: Secondary | ICD-10-CM | POA: Insufficient documentation

## 2019-04-22 DIAGNOSIS — S0993XA Unspecified injury of face, initial encounter: Secondary | ICD-10-CM | POA: Diagnosis present

## 2019-04-22 DIAGNOSIS — Z79899 Other long term (current) drug therapy: Secondary | ICD-10-CM | POA: Insufficient documentation

## 2019-04-22 DIAGNOSIS — Y998 Other external cause status: Secondary | ICD-10-CM | POA: Diagnosis not present

## 2019-04-22 DIAGNOSIS — T783XXA Angioneurotic edema, initial encounter: Secondary | ICD-10-CM | POA: Diagnosis not present

## 2019-04-22 LAB — COMPREHENSIVE METABOLIC PANEL
ALT: 20 U/L (ref 0–44)
AST: 41 U/L (ref 15–41)
Albumin: 4.2 g/dL (ref 3.5–5.0)
Alkaline Phosphatase: 47 U/L (ref 38–126)
Anion gap: 9 (ref 5–15)
BUN: 14 mg/dL (ref 6–20)
CO2: 25 mmol/L (ref 22–32)
Calcium: 9 mg/dL (ref 8.9–10.3)
Chloride: 104 mmol/L (ref 98–111)
Creatinine, Ser: 0.96 mg/dL (ref 0.44–1.00)
GFR calc Af Amer: >60 mL/min (ref >60)
GFR calc non Af Amer: >60 mL/min (ref >60)
Glucose, Bld: 95 mg/dL (ref 70–99)
Potassium: 3.7 mmol/L (ref 3.5–5.1)
Sodium: 138 mmol/L (ref 135–145)
Total Bilirubin: 0.5 mg/dL (ref 0.3–1.2)
Total Protein: 7.9 g/dL (ref 6.5–8.1)

## 2019-04-22 LAB — CBC WITH DIFFERENTIAL/PLATELET
Abs Immature Granulocytes: 0.01 10*3/uL (ref 0.00–0.07)
Basophils Absolute: 0 10*3/uL (ref 0.0–0.1)
Basophils Relative: 1 %
Eosinophils Absolute: 0 10*3/uL (ref 0.0–0.5)
Eosinophils Relative: 1 %
HCT: 37 % (ref 36.0–46.0)
Hemoglobin: 11.1 g/dL — ABNORMAL LOW (ref 12.0–15.0)
Immature Granulocytes: 0 %
Lymphocytes Relative: 54 %
Lymphs Abs: 3.3 10*3/uL (ref 0.7–4.0)
MCH: 22.6 pg — ABNORMAL LOW (ref 26.0–34.0)
MCHC: 30 g/dL (ref 30.0–36.0)
MCV: 75.4 fL — ABNORMAL LOW (ref 80.0–100.0)
Monocytes Absolute: 0.4 10*3/uL (ref 0.1–1.0)
Monocytes Relative: 6 %
Neutro Abs: 2.3 10*3/uL (ref 1.7–7.7)
Neutrophils Relative %: 38 %
Platelets: 297 10*3/uL (ref 150–400)
RBC: 4.91 MIL/uL (ref 3.87–5.11)
RDW: 16.5 % — ABNORMAL HIGH (ref 11.5–15.5)
WBC: 6 10*3/uL (ref 4.0–10.5)
nRBC: 0 % (ref 0.0–0.2)

## 2019-04-22 MED ORDER — IOHEXOL 300 MG/ML  SOLN
75.0000 mL | Freq: Once | INTRAMUSCULAR | Status: AC | PRN
  Administered 2019-04-22: 17:00:00 75 mL via INTRAVENOUS

## 2019-04-22 MED ORDER — SODIUM CHLORIDE 0.9 % IV BOLUS
1000.0000 mL | Freq: Once | INTRAVENOUS | Status: AC
  Administered 2019-04-22: 1000 mL via INTRAVENOUS

## 2019-04-22 MED ORDER — METHYLPREDNISOLONE SODIUM SUCC 125 MG IJ SOLR
125.0000 mg | Freq: Once | INTRAMUSCULAR | Status: AC
  Administered 2019-04-22: 125 mg via INTRAVENOUS
  Filled 2019-04-22: qty 2

## 2019-04-22 MED ORDER — DIPHENHYDRAMINE HCL 50 MG/ML IJ SOLN
25.0000 mg | Freq: Once | INTRAMUSCULAR | Status: AC
  Administered 2019-04-22: 25 mg via INTRAVENOUS
  Filled 2019-04-22: qty 1

## 2019-04-22 NOTE — Discharge Instructions (Addendum)
Stop lisinopril.  See your Physician for recheck on Monday

## 2019-04-22 NOTE — ED Provider Notes (Signed)
[facility] EMERGENCY DEPARTMENT Provider Note   CSN: [id number] Arrival date & time: 04/22/19  1328     History   Chief Complaint Chief Complaint  Patient presents with  . Oral Swelling    HPI Chloe Marks is a 43 y.o. female.     Patient reports her dog hit her in her lip earlier today.  Patient states that she has swelling in her lower lip and in her chin.  Patient states she originally thought the swelling was from the impact of the dog's head over she is concerned that the swelling may be coming from her lisinopril patient states her primary care physician doubled her dose of lisinopril this past week. Pt reports swelling has increased gradually.   The history is provided by the patient. No language interpreter was used.    Past Medical History:  Diagnosis Date  . Anemia   . Anxiety   . Family history of adverse reaction to anesthesia    PONV  . Fibroids   . Heart murmur   . Hypertension   . Ovarian cyst   . Pre-diabetes   . 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 S/P hysterectomy 12/15/2017  . Closed head injury without loss of consciousness 06/11/2011    Past Surgical History:  Procedure Laterality Date  . CHOLECYSTECTOMY    . DILITATION & CURRETTAGE/HYSTROSCOPY WITH NOVASURE ABLATION N/A 03/24/2017   Procedure: DILATATION & CURETTAGE/HYSTEROSCOPY WITH NOVASURE ABLATION;  Surgeon: Florian Buff, MD;  Location: [facility];  Service: Gynecology;  Laterality: N/A;  . SALPINGOOPHORECTOMY N/A 12/15/2017   Procedure: RIGHT SALPINGO OOPHORECTOMY;  Surgeon: Florian Buff, MD;  Location: [facility];  Service: Gynecology;  Laterality: N/A;  . SUPRACERVICAL ABDOMINAL HYSTERECTOMY N/A 12/15/2017   Procedure: HYSTERECTOMY SUPRACERVICAL ABDOMINAL;  Surgeon: Florian Buff, MD;  Location: [facility];  Service: Gynecology;  Laterality: N/A;  . TUBAL LIGATION    . UNILATERAL  SALPINGECTOMY N/A 12/15/2017   Procedure: LEFT SALPINGECTOMY;  Surgeon: Florian Buff, MD;  Location: [facility];  Service: Gynecology;  Laterality: N/A;     OB History    Gravida  3   Para  2   Term  2   Preterm      AB  1   Living  2     SAB  1   TAB      Ectopic      Multiple      Live Births  2            Home Medications    Prior to Admission medications   Medication Sig Start Date End Date Taking? Authorizing Provider  aspirin EC 81 MG tablet Take 81 mg by mouth daily.   Yes [provider]  cetirizine (ZYRTEC) 10 MG tablet Take 10 mg by mouth daily as needed for allergies.    Yes [provider]  cyclobenzaprine (FLEXERIL) 10 MG tablet Take 1 tablet (10 mg total) by mouth 3 (three) times daily. Patient taking differently: Take 10 mg by mouth daily as needed for muscle spasms.  01/24/18  Yes Lily Kocher, PA-C  docusate sodium (COLACE) 100 MG capsule Take 100 mg by mouth daily.   Yes [provider]  escitalopram (LEXAPRO) 10 MG tablet Take 10 mg by mouth daily.   Yes [provider]  ferrous sulfate 325 (  65 FE) MG EC tablet Take 325 mg by mouth 2 (two) times daily.   Yes [provider]  ketorolac (TORADOL) 10 MG tablet Take 1 tablet (10 mg total) by mouth every 8 (eight) hours as needed. 12/23/17  Yes Florian Buff, MD  lisinopril-hydrochlorothiazide (ZESTORETIC) 20-12.5 MG tablet Take 1 tablet by mouth daily.    Yes [provider]    Family History Family History  Problem Relation Age of Onset  . Hypertension Mother   . Non-Hodgkin's lymphoma Sister   . Other Paternal Grandmother        heart issues  . Hypertension Brother   . Diabetes Brother   . Stroke Brother   . Fibroids Sister     Social History Social History   Tobacco Use  . Smoking status: Former Smoker    Packs/day: 0.25    Years: 4.00    Pack years: 1.00    Types: Cigarettes, Cigars    Quit date: 03/22/2014    Years since  quitting: 5.0  . Smokeless tobacco: Never Used  Substance Use Topics  . Alcohol use: No  . Drug use: No     Allergies   Vicodin [hydrocodone-acetaminophen]   Review of Systems Review of Systems  All other systems reviewed and are negative.    Physical Exam Updated Vital Signs BP (!) 147/85   Pulse 75   Temp 98.1 F (36.7 C) (Oral)   Resp 18   Ht 5\' 7"  (1.702 m)   Wt 95.3 kg   LMP 06/01/2017   SpO2 100%   BMI 32.89 kg/m   Physical Exam Vitals signs and nursing note reviewed.  Constitutional:      Appearance: She is well-developed.  HENT:     Head: Normocephalic.     Mouth/Throat:     Mouth: Mucous membranes are moist.  Eyes:     Extraocular Movements: Extraocular movements intact.     Pupils: Pupils are equal, round, and reactive to light.  Neck:     Musculoskeletal: Normal range of motion.  Cardiovascular:     Rate and Rhythm: Normal rate and regular rhythm.  Pulmonary:     Effort: Pulmonary effort is normal.  Abdominal:     General: There is no distension.  Musculoskeletal: Normal range of motion.  Skin:    Capillary Refill: Capillary refill takes less than 2 seconds.  Neurological:     General: No focal deficit present.     Mental Status: She is alert and oriented to person, place, and time.  Psychiatric:        Mood and Affect: Mood normal.      ED Treatments / Results  Labs (all labs ordered are listed, but only abnormal results are displayed) Labs Reviewed  CBC WITH DIFFERENTIAL/PLATELET - Abnormal; Notable for the following components:      Result Value   Hemoglobin 11.1 (*)    MCV 75.4 (*)    MCH 22.6 (*)    RDW 16.5 (*)    All other components within normal limits  COMPREHENSIVE METABOLIC PANEL    EKG None  Radiology Ct Soft Tissue Neck W Contrast  Result Date: 04/22/2019 CLINICAL DATA:  Lower lip swelling. EXAM: CT NECK WITH CONTRAST TECHNIQUE: Multidetector CT imaging of the neck was performed using the standard protocol  following the bolus administration of intravenous contrast. CONTRAST:  72mL OMNIPAQUE IOHEXOL 300 MG/ML  SOLN COMPARISON:  None. FINDINGS: Pharynx and larynx: Normal. No mass or swelling. Salivary glands: No  inflammation, mass, or stone. Thyroid: Normal. Lymph nodes: None enlarged or abnormal density. Vascular: Negative. Limited intracranial: Negative. Visualized orbits: Negative. Mastoids and visualized paranasal sinuses: Clear. Skeleton: No acute or aggressive process. Upper chest: Negative. Other: Lower lip swelling without evidence of abscess or foreign body. No associated dental pathology. TMJs are located. IMPRESSION: Lower lip swelling without evidence for abscess or foreign body. Electronically Signed   By: Staci Righter M.D.   On: 04/22/2019 17:14    Procedures Procedures (including critical care time)  Medications Ordered in ED Medications  sodium chloride 0.9 % bolus 1,000 mL (0 mLs Intravenous Stopped 04/22/19 1617)  methylPREDNISolone sodium succinate (SOLU-MEDROL) 125 mg/2 mL injection 125 mg (125 mg Intravenous Given 04/22/19 1508)  diphenhydrAMINE (BENADRYL) injection 25 mg (25 mg Intravenous Given 04/22/19 1510)  iohexol (OMNIPAQUE) 300 MG/ML solution 75 mL (75 mLs Intravenous Contrast Given 04/22/19 1638)     Initial Impression / Assessment and Plan / ED Course  I have reviewed the triage vital signs and the nursing notes.  Pertinent labs & imaging results that were available during my care of the patient were reviewed by me and considered in my medical decision making (see chart for details).        Pt discussed with Dr. Patrcia Dolly,  Ct scan obtained.  No swelling neck/throat.  Pt observed,  Pt has an area on her lip that appears to be becoming bruised.  I suspect from impact.  I have advised her to stop lisinopril.  Pt advised to see her MD on Monday for recheck   Final Clinical Impressions(s) / ED Diagnoses   Final diagnoses:  Angioedema, initial encounter   Contusion of lip, initial encounter    ED Discharge Orders    None    An After Visit Summary was printed and given to the patient.    Sidney Ace 04/22/19 2129    Fredia Sorrow, MD 04/24/19 [phone]

## 2019-04-22 NOTE — ED Triage Notes (Signed)
Pt has lower lip swelling starting today around 10am. Pt on lisinopril x 1 mo. Pt states her dog's head hit her chin but not sure its related. Pt talking in full sentences, no trouble breathing.

## 2019-05-24 ENCOUNTER — Encounter: Payer: Self-pay | Admitting: Neurology

## 2019-05-24 ENCOUNTER — Other Ambulatory Visit: Payer: Self-pay

## 2019-05-24 ENCOUNTER — Ambulatory Visit (INDEPENDENT_AMBULATORY_CARE_PROVIDER_SITE_OTHER): Payer: 59 | Admitting: Neurology

## 2019-05-24 VITALS — BP 116/76 | HR 72 | Temp 97.6°F | Ht 67.0 in | Wt 207.5 lb

## 2019-05-24 DIAGNOSIS — IMO0002 Reserved for concepts with insufficient information to code with codable children: Secondary | ICD-10-CM | POA: Insufficient documentation

## 2019-05-24 DIAGNOSIS — G43709 Chronic migraine without aura, not intractable, without status migrainosus: Secondary | ICD-10-CM | POA: Diagnosis not present

## 2019-05-24 MED ORDER — NORTRIPTYLINE HCL 10 MG PO CAPS: 20.0000 mg | ORAL_CAPSULE | Freq: Every day | ORAL | 11 refills | Status: DC

## 2019-05-24 NOTE — Progress Notes (Signed)
PATIENT: Chloe Marks DOB: January 02, 1976  Chief Complaint  Patient presents with  . Hx of head injury    She is here with her husband, [name].  In September, reports losing grip on her dog's leash.  It lashed back and hit her forehead.  Since that injury, she has been experiencing frequent headaches, intermittent dizziness and numbness/tingling in feet/hands. She generally lays down to rest with her headaches.  If the pain is really bad, she has Toradol at home.   Marland Kitchen PCP    Scifres, Earlie Server, PA-C     HISTORICAL  Chloe Marks is a 43 year old female, accompanied by her husband [name], seen in request by her primary care PA Scifres, Earlie Server, for evaluation of increased headache, initial evaluation was on May 24, 2019.  I have reviewed and summarized the referring note from the referring physician.  She had a past medical history of anxiety, hypertension, I also personally reviewed MRI of scan in 2012, small right frontal lateral ventricle ischemic lesions, otherwise no significant abnormality  She denies a previous history of headache, in September 2020, while she walked her dog, she lost her grip of the leash, it latched back and hit her right forehead, she had a skin abrasion, bleeding, but does not require stitches, the scar healed very well, she had persistent mild pain around the scar area, in addition, she began to experience frequent headaches, about 3 times each week, bilateral frontal, retro-orbital area pressure headache, worsening with movement, sometimes intense pain also associated with light, noise sensitivity, dizziness, mild nausea, lasting for couple hours, relieved by taking a nap, or Toradol as needed, in addition, she complains of intermittent dizziness, spinning sensation, lasting for few hours, not necessarily associated with headaches, she denies hearing loss, no visual loss  Laboratory evaluation showed hemoglobin of 11.1, normal CMP, REVIEW OF SYSTEMS: Full 14  system review of systems performed and notable only for as above All other review of systems were negative.  ALLERGIES: Allergies  Allergen Reactions  . Vicodin [Hydrocodone-Acetaminophen] Nausea And Vomiting and Other (See Comments)    dizziness    HOME MEDICATIONS: Current Outpatient Medications  Medication Sig Dispense Refill  . aspirin EC 81 MG tablet Take 81 mg by mouth daily.    . cetirizine (ZYRTEC) 10 MG tablet Take 10 mg by mouth daily as needed for allergies.     . cyclobenzaprine (FLEXERIL) 10 MG tablet Take 1 tablet (10 mg total) by mouth 3 (three) times daily. (Patient taking differently: Take 10 mg by mouth daily as needed for muscle spasms. ) 20 tablet 0  . docusate sodium (COLACE) 100 MG capsule Take 100 mg by mouth daily.    Marland Kitchen escitalopram (LEXAPRO) 10 MG tablet Take 10 mg by mouth daily.    . ferrous sulfate 325 (65 FE) MG EC tablet Take 325 mg by mouth 2 (two) times daily.    Marland Kitchen ketorolac (TORADOL) 10 MG tablet Take 1 tablet (10 mg total) by mouth every 8 (eight) hours as needed. 15 tablet 0  . losartan-hydrochlorothiazide (HYZAAR) 50-12.5 MG tablet Take 1 tablet by mouth daily.     No current facility-administered medications for this visit.     PAST MEDICAL HISTORY: Past Medical History:  Diagnosis Date  . Anemia   . Anxiety   . Family history of adverse reaction to anesthesia    PONV  . Fibroids   . Headache   . Heart murmur   . Hypertension   . Ovarian  cyst   . Pre-diabetes   . Stroke [facility]) august 2011   after gall bladder surgery; has lesion of left side of brain    PAST SURGICAL HISTORY: Past Surgical History:  Procedure Laterality Date  . CHOLECYSTECTOMY    . DILITATION & CURRETTAGE/HYSTROSCOPY WITH NOVASURE ABLATION N/A 03/24/2017   Procedure: DILATATION & CURETTAGE/HYSTEROSCOPY WITH NOVASURE ABLATION;  Surgeon: Florian Buff, MD;  Location: [facility];  Service: Gynecology;  Laterality: N/A;  . SALPINGOOPHORECTOMY N/A 12/15/2017   Procedure: RIGHT  SALPINGO OOPHORECTOMY;  Surgeon: Florian Buff, MD;  Location: [facility];  Service: Gynecology;  Laterality: N/A;  . SUPRACERVICAL ABDOMINAL HYSTERECTOMY N/A 12/15/2017   Procedure: HYSTERECTOMY SUPRACERVICAL ABDOMINAL;  Surgeon: Florian Buff, MD;  Location: [facility];  Service: Gynecology;  Laterality: N/A;  . TUBAL LIGATION    . UNILATERAL SALPINGECTOMY N/A 12/15/2017   Procedure: LEFT SALPINGECTOMY;  Surgeon: Florian Buff, MD;  Location: [facility];  Service: Gynecology;  Laterality: N/A;    FAMILY HISTORY: Family History  Problem Relation Age of Onset  . Hypertension Mother   . Non-Hodgkin's lymphoma Sister   . Stomach cancer Father   . Other Paternal Grandmother        heart issues  . Hypertension Brother   . Diabetes Brother   . Stroke Brother   . Fibroids Sister     SOCIAL HISTORY: Social History   Socioeconomic History  . Marital status: Married    Spouse name: Not on file  . Number of children: 2  . Years of education: some college  . Highest education level: Not on file  Occupational History  . Occupation: CMA  Social Needs  . Financial resource strain: Not on file  . Food insecurity    Worry: Not on file    Inability: Not on file  . Transportation needs    Medical: Not on file    Non-medical: Not on file  Tobacco Use  . Smoking status: Former Smoker    Packs/day: 0.25    Years: 4.00    Pack years: 1.00    Types: Cigarettes, Cigars    Quit date: 03/22/2014    Years since quitting: 5.1  . Smokeless tobacco: Never Used  Substance and Sexual Activity  . Alcohol use: No  . Drug use: No  . Sexual activity: Not Currently    Birth control/protection: Surgical    Comment: tubal  Lifestyle  . Physical activity    Days per week: Not on file    Minutes per session: Not on file  . Stress: Not on file  Relationships  . Social Herbalist on phone: Not on file    Gets together: Not on file    Attends religious service: Not on file    Active member of club  or organization: Not on file    Attends meetings of clubs or organizations: Not on file    Relationship status: Not on file  . Intimate partner violence    Fear of current or ex partner: Not on file    Emotionally abused: Not on file    Physically abused: Not on file    Forced sexual activity: Not on file  Other Topics Concern  . Not on file  Social History Narrative   Lives at home with her husband.   One cup coffee, maybe cup of soda per day.   Right-handed.     PHYSICAL EXAM   Vitals:   05/24/19 0750  BP:  116/76  Pulse: 72  Temp: 97.6 F (36.4 C)  Weight: 207 lb 8 oz (94.1 kg)  Height: 5\' 7"  (1.702 m)    Not recorded      Body mass index is 32.5 kg/m.  PHYSICAL EXAMNIATION:  Gen: NAD, conversant, well nourised, well groomed                     Cardiovascular: Regular rate rhythm, no peripheral edema, warm, nontender. Eyes: Conjunctivae clear without exudates or hemorrhage Neck: Supple, no carotid bruits. Pulmonary: Clear to auscultation bilaterally  Skin: Right forehead well-healed scar, mild tenderness around the scar area  NEUROLOGICAL EXAM:  MENTAL STATUS: Speech:    Speech is normal; fluent and spontaneous with normal comprehension.  Cognition:     Orientation to time, place and person     Normal recent and remote memory     Normal Attention span and concentration     Normal Language, naming, repeating,spontaneous speech     Fund of knowledge   CRANIAL NERVES: CN II: Visual fields are full to confrontation.  Pupils are round equal and briskly reactive to light. CN III, IV, VI: extraocular movement are normal. No ptosis. CN V: Facial sensation is intact to pinprick in all 3 divisions bilaterally. Corneal responses are intact.  CN VII: Face is symmetric with normal eye closure and smile. CN VIII: Hearing is normal to causal conversation. CN IX, X: Palate elevates symmetrically. Phonation is normal. CN XI: Head turning and shoulder shrug are intact  CN XII: Tongue is midline with normal movements and no atrophy.  MOTOR: There is no pronator drift of out-stretched arms. Muscle bulk and tone are normal. Muscle strength is normal.  REFLEXES: Reflexes are 2+ and symmetric at the biceps, triceps, knees, and ankles. Plantar responses are flexor.  SENSORY: Intact to light touch, pinprick, positional sensation and vibratory sensation are intact in fingers and toes.  COORDINATION: Rapid alternating movements and fine finger movements are intact. There is no dysmetria on finger-to-nose and heel-knee-shin.    GAIT/STANCE: Posture is normal. Gait is steady with normal steps, base, arm swing, and turning. Heel and toe walking are normal. Tandem gait is normal.  Romberg is absent.   DIAGNOSTIC DATA (LABS, IMAGING, TESTING) - I reviewed patient records, labs, notes, testing and imaging myself where available.   ASSESSMENT AND PLAN  TAJ DILS is a 43 y.o. female   Chronic migraine headaches  The headache has a lot of migraine features  Will try preventive medication nortriptyline 10 mg every night titrating to 20 mg every night as preventive medications  NSAIDs as needed   Marcial Pacas, M.D. Ph.D.  [facility] [address] [place], [place] [address] Ph: [phone] Fax: [phone]  CC: Scifres, Earlie Server, [place]

## 2019-08-09 ENCOUNTER — Ambulatory Visit: Payer: 59 | Admitting: Neurology

## 2019-11-16 ENCOUNTER — Encounter: Payer: Self-pay | Admitting: Cardiology

## 2019-11-16 DIAGNOSIS — I10 Essential (primary) hypertension: Secondary | ICD-10-CM | POA: Diagnosis not present

## 2019-11-16 DIAGNOSIS — S0990XS Unspecified injury of head, sequela: Secondary | ICD-10-CM | POA: Diagnosis not present

## 2019-11-16 DIAGNOSIS — F419 Anxiety disorder, unspecified: Secondary | ICD-10-CM | POA: Diagnosis not present

## 2019-11-16 DIAGNOSIS — M62838 Other muscle spasm: Secondary | ICD-10-CM | POA: Diagnosis not present

## 2019-11-16 DIAGNOSIS — R079 Chest pain, unspecified: Secondary | ICD-10-CM | POA: Diagnosis not present

## 2019-11-22 DIAGNOSIS — R072 Precordial pain: Secondary | ICD-10-CM | POA: Insufficient documentation

## 2019-11-22 DIAGNOSIS — I6523 Occlusion and stenosis of bilateral carotid arteries: Secondary | ICD-10-CM | POA: Insufficient documentation

## 2019-11-22 DIAGNOSIS — Z7189 Other specified counseling: Secondary | ICD-10-CM | POA: Insufficient documentation

## 2019-11-22 NOTE — Progress Notes (Signed)
Cardiology Office Note   Date:  11/23/2019   ID:  SUMAYO FOGLIA, DOB 11/01/1975, MRN [id number]  PCP:  Maude Leriche, PA-C  Cardiologist:   No primary care provider on file. Referring:  Scifres, Earlie Server, PA-C  Chief Complaint  Patient presents with  . Chest Pain      History of Present Illness: Chloe Marks is a 44 y.o. female who is referred by Scifres, Earlie Server, PA-C for evaluation of chest pain.  The patient has no past cardiac history though she did have a stroke related to hypertension in 2012.  She presents now with chest discomfort.  This has been going on for several months.  It is getting worse.  Is happening more frequently 2-3 times per week.  It is intense 8 out of 10.  It is happening for many minutes at a time when it happens.  It is a sharp discomfort localized to the mid to upper left area of her sternum.  There is no radiation to her jaw or to her arms.  Its not like her previous gallbladder or other pain.  It happens at rest.  It is sporadic.  She cannot bring it on with activities.  It is sharp.  She has difficulty taking a deep breath when it happens.  She is not describing nausea vomiting or diaphoresis.  It goes away spontaneously after several minutes and comes on without provocation.  Its not positional.  She has had no prior cardiac work-up of this.  She has not had this symptom before.   Past Medical History:  Diagnosis Date  . Anemia   . Anxiety   . Family history of adverse reaction to anesthesia    PONV  . Fibroids   . Headache   . Heart murmur   . Hypertension   . Ovarian cyst   . Pre-diabetes   . Stroke [facility]) august 2011   after gall bladder surgery; has lesion of left side of brain    Past Surgical History:  Procedure Laterality Date  . CHOLECYSTECTOMY    . DILITATION & CURRETTAGE/HYSTROSCOPY WITH NOVASURE ABLATION N/A 03/24/2017   Procedure: DILATATION & CURETTAGE/HYSTEROSCOPY WITH NOVASURE ABLATION;  Surgeon: Florian Buff,  MD;  Location: [facility];  Service: Gynecology;  Laterality: N/A;  . SALPINGOOPHORECTOMY N/A 12/15/2017   Procedure: RIGHT SALPINGO OOPHORECTOMY;  Surgeon: Florian Buff, MD;  Location: [facility];  Service: Gynecology;  Laterality: N/A;  . SUPRACERVICAL ABDOMINAL HYSTERECTOMY N/A 12/15/2017   Procedure: HYSTERECTOMY SUPRACERVICAL ABDOMINAL;  Surgeon: Florian Buff, MD;  Location: [facility];  Service: Gynecology;  Laterality: N/A;  . TUBAL LIGATION    . UNILATERAL SALPINGECTOMY N/A 12/15/2017   Procedure: LEFT SALPINGECTOMY;  Surgeon: Florian Buff, MD;  Location: [facility];  Service: Gynecology;  Laterality: N/A;     Current Outpatient Medications  Medication Sig Dispense Refill  . aspirin EC 81 MG tablet Take 81 mg by mouth daily.    . cetirizine (ZYRTEC) 10 MG tablet Take 10 mg by mouth daily as needed for allergies.     . cyclobenzaprine (FLEXERIL) 10 MG tablet Take 1 tablet (10 mg total) by mouth 3 (three) times daily. (Patient taking differently: Take 10 mg by mouth daily as needed for muscle spasms. ) 20 tablet 0  . docusate sodium (COLACE) 100 MG capsule Take 100 mg by mouth daily.    Marland Kitchen escitalopram (LEXAPRO) 10 MG tablet Take 10 mg by mouth daily.    . ferrous sulfate  325 (65 FE) MG EC tablet Take 325 mg by mouth 2 (two) times daily.    Marland Kitchen ketorolac (TORADOL) 10 MG tablet Take 1 tablet (10 mg total) by mouth every 8 (eight) hours as needed. 15 tablet 0  . losartan-hydrochlorothiazide (HYZAAR) 50-12.5 MG tablet Take 1 tablet by mouth daily.    . naproxen sodium (ANAPROX) 550 MG tablet SMARTSIG:1 Tablet(s) By Mouth Every 12 Hours PRN     No current facility-administered medications for this visit.    Allergies:   Lisinopril and Vicodin [hydrocodone-acetaminophen]    Social History:  The patient  reports that she quit smoking about 5 years ago. Her smoking use included cigarettes and cigars. She has a 1.00 pack-year smoking history. She has never used smokeless tobacco. She reports that she does  not drink alcohol or use drugs.   Family History:  The patient's family history includes Diabetes in her brother; Fibroids in her sister; Heart failure (age of onset: 62) in her brother; Hypertension in her brother and mother; Non-Hodgkin's lymphoma in her sister; Other in her paternal grandmother; Stomach cancer in her father; Stroke in her brother.    ROS:  Please see the history of present illness.   Otherwise, review of systems are positive for none.   All other systems are reviewed and negative.    PHYSICAL EXAM: VS:  BP 128/86   Pulse 75   Temp (!) 97.3 F (36.3 C)   Ht 5\' 7"  (1.702 m)   Wt 214 lb (97.1 kg)   LMP 06/01/2017   SpO2 97%   BMI 33.52 kg/m  , BMI Body mass index is 33.52 kg/m. GENERAL:  Well appearing HEENT:  Pupils equal round and reactive, fundi not visualized, oral mucosa unremarkable NECK:  No jugular venous distention, waveform within normal limits, carotid upstroke brisk and symmetric, no bruits, no thyromegaly LYMPHATICS:  No cervical, inguinal adenopathy LUNGS:  Clear to auscultation bilaterally BACK:  No CVA tenderness CHEST:  Unremarkable HEART:  PMI not displaced or sustained,S1 and S2 within normal limits, no S3, no S4, no clicks, no rubs, no murmurs ABD:  Flat, positive bowel sounds normal in frequency in pitch, no bruits, no rebound, no guarding, no midline pulsatile mass, no hepatomegaly, no splenomegaly EXT:  2 plus pulses throughout, no edema, no cyanosis no clubbing SKIN:  No rashes no nodules NEURO:  Cranial nerves II through XII grossly intact, motor grossly intact throughout PSYCH:  Cognitively intact, oriented to person place and time    EKG:  EKG is ordered today. The ekg ordered today demonstrates sinus rhythm, rate 75, axis within normal limits, low voltage, poor anterior R wave progression, no acute ST-T wave changes.   Recent Labs: 04/22/2019: ALT 20; BUN 14; Creatinine, Ser 0.96; Hemoglobin 11.1; Platelets 297; Potassium 3.7;  Sodium 138    Lipid Panel No results found for: CHOL, TRIG, HDL, CHOLHDL, VLDL, LDLCALC, LDLDIRECT    Wt Readings from Last 3 Encounters:  11/23/19 214 lb (97.1 kg)  05/24/19 207 lb 8 oz (94.1 kg)  04/22/19 210 lb (95.3 kg)      Other studies Reviewed: Additional studies/ records that were reviewed today include:   Primary care office records. Review of the above records demonstrates:  Please see elsewhere in the note.     ASSESSMENT AND PLAN:  CHEST PAIN: The patient's chest pain is somewhat atypical.  However, she has had some mild carotid plaque.  She had a previous stroke.  She has significant cardiovascular risk  factors. I will bring the patient back for a POET (Plain Old Exercise Test). This will allow me to screen for obstructive coronary disease, risk stratify and very importantly provide a prescription for exercise.  I would also like her to have a coronary calcium score.  Further evaluation will be based on these results.  We talked at great length about risk reduction.  TOBACCO: We talked about the need to stop smoking.  LIPIDS: Her LDL is mildly elevated 112 with an HDL of 57.  Goals of therapy will be based on the above.  HTN: Blood pressure seems to be controlled on the meds as listed.  No  change in therapy.  CAROTID PLAQUE:   She had a Doppler in 2013.  She needs follow up Dopplers.  COVID EDUCATOIN: She does not have the vaccine we talked about this.    Current medicines are reviewed at length with the patient today.  The patient does not have concerns regarding medicines.  The following changes have been made:  no change  Labs/ tests ordered today include:   Orders Placed This Encounter  Procedures  . CT CARDIAC SCORING  . EXERCISE TOLERANCE TEST (ETT)  . EKG 12-Lead     Disposition:   FU with me based on the results of the above.   Signed, Minus Breeding, MD  11/23/2019 1:20 PM    [facility]

## 2019-11-23 ENCOUNTER — Other Ambulatory Visit: Payer: Self-pay

## 2019-11-23 ENCOUNTER — Ambulatory Visit (INDEPENDENT_AMBULATORY_CARE_PROVIDER_SITE_OTHER): Payer: 59 | Admitting: Cardiology

## 2019-11-23 ENCOUNTER — Encounter: Payer: Self-pay | Admitting: Cardiology

## 2019-11-23 VITALS — BP 128/86 | HR 75 | Temp 97.3°F | Ht 67.0 in | Wt 214.0 lb

## 2019-11-23 DIAGNOSIS — I6523 Occlusion and stenosis of bilateral carotid arteries: Secondary | ICD-10-CM | POA: Diagnosis not present

## 2019-11-23 DIAGNOSIS — Z7189 Other specified counseling: Secondary | ICD-10-CM

## 2019-11-23 DIAGNOSIS — R072 Precordial pain: Secondary | ICD-10-CM

## 2019-11-23 NOTE — Progress Notes (Signed)
Carotid dopplers ordered per Dr. Percival Spanish. Message sent to scheduling. Message left for patient per DPR.

## 2019-11-23 NOTE — Patient Instructions (Signed)
Medication Instructions:  NO CHANGES *If you need a refill on your cardiac medications before your next appointment, please call your pharmacy*  Lab Work: You will need a Covid Screening 3 days prior to your exercise tolerance test. You will need to self quarantine after the screening until your procedure.This is a Drive Up Visit at the [facility] [address], [place]. Someone will direct you to the appropriate testing line. Stay in your car and someone will be with you shortly  Testing/Procedures: Your physician has requested that you have an exercise tolerance test. For further information please visit [email or web address]. Please also follow instruction sheet, as given.  CORONARY CALCIUM SCORE  Follow-Up: At [facility], you and your health needs are our priority.  As part of our continuing mission to provide you with exceptional heart care, we have created designated Provider Care Teams.  These Care Teams include your primary Cardiologist (physician) and Advanced Practice Providers (APPs -  Physician Assistants and Nurse Practitioners) who all work together to provide you with the care you need, when you need it.   Your next appointment:   FOLLOW UP AS NEEDED  Other Instructions Dr. Percival Spanish has ordered a CT coronary calcium score. This test is done at [address]. This is $150 out of pocket. Coronary CalciumScan A coronary calcium scan is an imaging test used to look for deposits of calcium and other fatty materials (plaques) in the inner lining of the blood vessels of the heart (coronary arteries). These deposits of calcium and plaques can partly clog and narrow the coronary arteries without producing any symptoms or warning signs. This puts a person at risk for a heart attack. This test can detect these deposits before symptoms develop. Tell a health care provider about:  Any allergies you have.  All medicines you are taking, including  vitamins, herbs, eye drops, creams, and over-the-counter medicines.  Any problems you or family members have had with anesthetic medicines.  Any blood disorders you have.  Any surgeries you have had.  Any medical conditions you have.  Whether you are pregnant or may be pregnant. What are the risks? Generally, this is a safe procedure. However, problems may occur, including:  Harm to a pregnant woman and her unborn baby. This test involves the use of radiation. Radiation exposure can be dangerous to a pregnant woman and her unborn baby. If you are pregnant, you generally should not have this procedure done.  Slight increase in the risk of cancer. This is because of the radiation involved in the test. What happens before the procedure? No preparation is needed for this procedure. What happens during the procedure?  You will undress and remove any jewelry around your neck or chest.  You will put on a hospital gown.  Sticky electrodes will be placed on your chest. The electrodes will be connected to an electrocardiogram (ECG) machine to record a tracing of the electrical activity of your heart.  A CT scanner will take pictures of your heart. During this time, you will be asked to lie still and hold your breath for 2-3 seconds while a picture of your heart is being taken. The procedure may vary among health care providers and hospitals. What happens after the procedure?  You can get dressed.  You can return to your normal activities.  It is up to you to get the results of your test. Ask your health care provider, or the department that is doing the  test, when your results will be ready. Summary  A coronary calcium scan is an imaging test used to look for deposits of calcium and other fatty materials (plaques) in the inner lining of the blood vessels of the heart (coronary arteries).  Generally, this is a safe procedure. Tell your health care provider if you are pregnant or may be  pregnant.  No preparation is needed for this procedure.  A CT scanner will take pictures of your heart.  You can return to your normal activities after the scan is done. This information is not intended to replace advice given to you by your health care provider. Make sure you discuss any questions you have with your health care provider. Document Released: 12/26/2007 Document Revised: 05/18/2016 Document Reviewed: 05/18/2016 Elsevier Interactive Patient Education  2017 [employer].

## 2019-11-24 ENCOUNTER — Encounter: Payer: Self-pay | Admitting: Neurology

## 2019-11-27 ENCOUNTER — Other Ambulatory Visit: Payer: Self-pay

## 2019-11-27 ENCOUNTER — Ambulatory Visit (HOSPITAL_COMMUNITY)
Admission: RE | Admit: 2019-11-27 | Discharge: 2019-11-27 | Disposition: A | Discharge status: Home / Self Care | Payer: 59 | Source: Ambulatory Visit | Attending: Cardiovascular Disease | Admitting: Cardiovascular Disease

## 2019-11-27 DIAGNOSIS — I6523 Occlusion and stenosis of bilateral carotid arteries: Secondary | ICD-10-CM | POA: Diagnosis not present

## 2019-12-01 DIAGNOSIS — R945 Abnormal results of liver function studies: Secondary | ICD-10-CM | POA: Diagnosis not present

## 2019-12-05 ENCOUNTER — Ambulatory Visit (INDEPENDENT_AMBULATORY_CARE_PROVIDER_SITE_OTHER)
Admission: RE | Admit: 2019-12-05 | Discharge: 2019-12-05 | Disposition: A | Discharge status: Home / Self Care | Payer: Self-pay | Source: Ambulatory Visit | Attending: Cardiology | Admitting: Cardiology

## 2019-12-05 ENCOUNTER — Other Ambulatory Visit: Payer: Self-pay

## 2019-12-05 DIAGNOSIS — R072 Precordial pain: Secondary | ICD-10-CM

## 2019-12-05 DIAGNOSIS — I6523 Occlusion and stenosis of bilateral carotid arteries: Secondary | ICD-10-CM

## 2019-12-05 NOTE — Progress Notes (Signed)
Repeat carotid dopplers in 1 year per Dr. Percival Spanish, order placed.

## 2019-12-14 ENCOUNTER — Telehealth (HOSPITAL_COMMUNITY): Payer: Self-pay

## 2019-12-14 NOTE — Telephone Encounter (Signed)
Encounter complete. 

## 2019-12-16 ENCOUNTER — Other Ambulatory Visit (HOSPITAL_COMMUNITY)
Admission: RE | Admit: 2019-12-16 | Discharge: 2019-12-16 | Disposition: A | Discharge status: Home / Self Care | Payer: 59 | Source: Ambulatory Visit | Attending: Cardiology | Admitting: Cardiology

## 2019-12-16 DIAGNOSIS — Z20822 Contact with and (suspected) exposure to covid-19: Secondary | ICD-10-CM | POA: Insufficient documentation

## 2019-12-16 DIAGNOSIS — Z01812 Encounter for preprocedural laboratory examination: Secondary | ICD-10-CM | POA: Insufficient documentation

## 2019-12-16 LAB — SARS CORONAVIRUS 2 (TAT 6-24 HRS): SARS Coronavirus 2: NEGATIVE

## 2019-12-20 ENCOUNTER — Encounter (HOSPITAL_COMMUNITY): Payer: 59

## 2019-12-20 ENCOUNTER — Ambulatory Visit (HOSPITAL_COMMUNITY)
Admission: RE | Admit: 2019-12-20 | Discharge: 2019-12-20 | Disposition: A | Discharge status: Home / Self Care | Payer: 59 | Source: Ambulatory Visit | Attending: Internal Medicine | Admitting: Internal Medicine

## 2019-12-20 ENCOUNTER — Other Ambulatory Visit: Payer: Self-pay

## 2019-12-20 DIAGNOSIS — R072 Precordial pain: Secondary | ICD-10-CM | POA: Diagnosis not present

## 2019-12-20 LAB — EXERCISE TOLERANCE TEST
Estimated workload: 8.5 METS
Exercise duration (min): 7 min
Exercise duration (sec): 1 s
MPHR: 176 {beats}/min
Peak HR: 162 {beats}/min
Percent HR: 92 %
Rest HR: 65 {beats}/min

## 2020-02-12 NOTE — Progress Notes (Deleted)
NEUROLOGY CONSULTATION NOTE  Chloe Marks MRN: [id number] DOB: 1976/05/29  Referring provider: Maude Leriche, PA-C Primary care provider: Maude Leriche, PA-C  Reason for consult:  Second opinion regarding headache  HISTORY OF PRESENT ILLNESS: Chloe Marks is a 44 year old ***-handed female who presents for second opinion regarding headache.  History supplemented by neurology and referring provider's notes.  In September 2020, she was walking her dog when she lost her grip on the leash and the buckle hit her on the right side of her forehead.  No fall or loss of consciousness.  ***.  She saw Dr. Krista Blue at [facility] in November who diagnosed her with migraine and started her on nortriptyline.  ***  Current NSAIDS:  ASA 81mg  daily, ketorolac 10mg  PRN Current analgesics:  *** Current triptans:  *** Current ergotamine:  *** Current anti-emetic:  *** Current muscle relaxants:  Flexeril 10mg  daily PRN Current anti-anxiolytic:  *** Current sleep aide:  *** Current Antihypertensive medications:  losartain-HCTZ Current Antidepressant medications:  Lexapro 10mg  daily Current Anticonvulsant medications:  *** Current anti-CGRP:  *** Current Vitamins/Herbal/Supplements:  *** Current Antihistamines/Decongestants:  Zyrtec Other therapy:  *** Hormone/birth control:  *** Other medications:  ***  Past NSAIDS:  *** Past analgesics:  *** Past abortive triptans:  *** Past abortive ergotamine:  *** Past muscle relaxants:  *** Past anti-emetic:  *** Past antihypertensive medications:  *** Past antidepressant medications:  *** Past anticonvulsant medications:  *** Past anti-CGRP:  *** Past vitamins/Herbal/Supplements:  *** Past antihistamines/decongestants:  *** Other past therapies:  ***  Caffeine:  *** Alcohol:  *** Smoker:  *** Diet:  *** Exercise:  *** Depression:  ***; Anxiety:  *** Other pain:  *** Sleep hygiene:  *** Family history of  headache:  ***   PAST MEDICAL HISTORY: Past Medical History:  Diagnosis Date  . Anemia   . Anxiety   . Family history of adverse reaction to anesthesia    PONV  . Fibroids   . Headache   . Hypertension   . Ovarian cyst   . Pre-diabetes   . Stroke [facility]) august 2011   after gall bladder surgery; has lesion of left side of brain    PAST SURGICAL HISTORY: Past Surgical History:  Procedure Laterality Date  . CHOLECYSTECTOMY    . DILITATION & CURRETTAGE/HYSTROSCOPY WITH NOVASURE ABLATION N/A 03/24/2017   Procedure: DILATATION & CURETTAGE/HYSTEROSCOPY WITH NOVASURE ABLATION;  Surgeon: Florian Buff, MD;  Location: [facility];  Service: Gynecology;  Laterality: N/A;  . SALPINGOOPHORECTOMY N/A 12/15/2017   Procedure: RIGHT SALPINGO OOPHORECTOMY;  Surgeon: Florian Buff, MD;  Location: [facility];  Service: Gynecology;  Laterality: N/A;  . SUPRACERVICAL ABDOMINAL HYSTERECTOMY N/A 12/15/2017   Procedure: HYSTERECTOMY SUPRACERVICAL ABDOMINAL;  Surgeon: Florian Buff, MD;  Location: [facility];  Service: Gynecology;  Laterality: N/A;  . TUBAL LIGATION    . UNILATERAL SALPINGECTOMY N/A 12/15/2017   Procedure: LEFT SALPINGECTOMY;  Surgeon: Florian Buff, MD;  Location: [facility];  Service: Gynecology;  Laterality: N/A;    MEDICATIONS: Current Outpatient Medications on File Prior to Visit  Medication Sig Dispense Refill  . aspirin EC 81 MG tablet Take 81 mg by mouth daily.    . cetirizine (ZYRTEC) 10 MG tablet Take 10 mg by mouth daily as needed for allergies.     . cyclobenzaprine (FLEXERIL) 10 MG tablet Take 1 tablet (10 mg total) by mouth 3 (three) times daily. (Patient taking differently: Take 10 mg by mouth daily  as needed for muscle spasms. ) 20 tablet 0  . docusate sodium (COLACE) 100 MG capsule Take 100 mg by mouth daily.    Marland Kitchen escitalopram (LEXAPRO) 10 MG tablet Take 10 mg by mouth daily.    . ferrous sulfate 325 (65 FE) MG EC tablet Take 325 mg by mouth 2 (two) times daily.    Marland Kitchen ketorolac (TORADOL)  10 MG tablet Take 1 tablet (10 mg total) by mouth every 8 (eight) hours as needed. 15 tablet 0  . losartan-hydrochlorothiazide (HYZAAR) 50-12.5 MG tablet Take 1 tablet by mouth daily.    . naproxen sodium (ANAPROX) 550 MG tablet SMARTSIG:1 Tablet(s) By Mouth Every 12 Hours PRN     No current facility-administered medications on file prior to visit.    ALLERGIES: Allergies  Allergen Reactions  . Lisinopril     Lip swelling  . Vicodin [Hydrocodone-Acetaminophen] Nausea And Vomiting and Other (See Comments)    dizziness    FAMILY HISTORY: Family History  Problem Relation Age of Onset  . Hypertension Mother   . Non-Hodgkin's lymphoma Sister   . Stomach cancer Father   . Other Paternal Grandmother        heart issues  . Hypertension Brother   . Diabetes Brother   . Stroke Brother   . Heart failure Brother 6  . Fibroids Sister    ***.  SOCIAL HISTORY: Social History   Socioeconomic History  . Marital status: Married    Spouse name: Not on file  . Number of children: 2  . Years of education: some college  . Highest education level: Not on file  Occupational History  . Occupation: CMA  Tobacco Use  . Smoking status: Former Smoker    Packs/day: 0.25    Years: 4.00    Pack years: 1.00    Types: Cigarettes, Cigars    Quit date: 03/22/2014    Years since quitting: 5.8  . Smokeless tobacco: Never Used  Vaping Use  . Vaping Use: Never used  Substance and Sexual Activity  . Alcohol use: No  . Drug use: No  . Sexual activity: Not Currently    Birth control/protection: Surgical    Comment: tubal  Other Topics Concern  . Not on file  Social History Narrative   Lives at home with her husband.   One cup coffee, maybe cup of soda per day.   Right-handed.   Social Determinants of Health   Financial Resource Strain:   . Difficulty of Paying Living Expenses:   Food Insecurity:   . Worried About Charity fundraiser in the Last Year:   . Arboriculturist in the Last  Year:   Transportation Needs:   . Film/video editor (Medical):   Marland Kitchen Lack of Transportation (Non-Medical):   Physical Activity:   . Days of Exercise per Week:   . Minutes of Exercise per Session:   Stress:   . Feeling of Stress :   Social Connections:   . Frequency of Communication with Friends and Family:   . Frequency of Social Gatherings with Friends and Family:   . Attends Religious Services:   . Active Member of Clubs or Organizations:   . Attends Archivist Meetings:   Marland Kitchen Marital Status:   Intimate Partner Violence:   . Fear of Current or Ex-Partner:   . Emotionally Abused:   Marland Kitchen Physically Abused:   . Sexually Abused:     REVIEW OF SYSTEMS: Constitutional: No fevers, chills, or  sweats, no generalized fatigue, change in appetite Eyes: No visual changes, double vision, eye pain Ear, nose and throat: No hearing loss, ear pain, nasal congestion, sore throat Cardiovascular: No chest pain, palpitations Respiratory:  No shortness of breath at rest or with exertion, wheezes GastrointestinaI: No nausea, vomiting, diarrhea, abdominal pain, fecal incontinence Genitourinary:  No dysuria, urinary retention or frequency Musculoskeletal:  No neck pain, back pain Integumentary: No rash, pruritus, skin lesions Neurological: as above Psychiatric: No depression, insomnia, anxiety Endocrine: No palpitations, fatigue, diaphoresis, mood swings, change in appetite, change in weight, increased thirst Hematologic/Lymphatic:  No purpura, petechiae. Allergic/Immunologic: no itchy/runny eyes, nasal congestion, recent allergic reactions, rashes  PHYSICAL EXAM: *** General: No acute distress.  Patient appears ***-groomed.  *** Head:  Normocephalic/atraumatic Eyes:  fundi examined but not visualized Neck: supple, no paraspinal tenderness, full range of motion Back: No paraspinal tenderness Heart: regular rate and rhythm Lungs: Clear to auscultation bilaterally. Vascular: No carotid  bruits. Neurological Exam: Mental status: alert and oriented to person, place, and time, recent and remote memory intact, fund of knowledge intact, attention and concentration intact, speech fluent and not dysarthric, language intact. Cranial nerves: CN I: not tested CN II: pupils equal, round and reactive to light, visual fields intact CN III, IV, VI:  full range of motion, no nystagmus, no ptosis CN V: facial sensation intact CN VII: upper and lower face symmetric CN VIII: hearing intact CN IX, X: gag intact, uvula midline CN XI: sternocleidomastoid and trapezius muscles intact CN XII: tongue midline Bulk & Tone: normal, no fasciculations. Motor:  5/5 throughout *** Sensation:  Pinprick *** temperature *** and vibration sensation intact.  ***. Deep Tendon Reflexes:  2+ throughout, *** toes downgoing.  *** Finger to nose testing:  Without dysmetria.  *** Heel to shin:  Without dysmetria.  *** Gait:  Normal station and stride.  Able to turn and tandem walk. Romberg ***.  IMPRESSION: ***  PLAN: ***  Thank you for allowing me to take part in the care of this patient.  Metta Clines, DO  CC: ***

## 2020-02-13 ENCOUNTER — Ambulatory Visit: Payer: 59 | Admitting: Neurology

## 2020-11-27 ENCOUNTER — Ambulatory Visit (HOSPITAL_COMMUNITY): Payer: Self-pay

## 2020-12-23 ENCOUNTER — Ambulatory Visit (HOSPITAL_COMMUNITY)
Admission: RE | Admit: 2020-12-23 | Payer: Self-pay | Source: Ambulatory Visit | Attending: Cardiology | Admitting: Cardiology

## 2021-02-25 ENCOUNTER — Encounter (HOSPITAL_COMMUNITY): Payer: Self-pay

## 2021-03-03 ENCOUNTER — Encounter: Payer: Self-pay | Admitting: *Deleted

## 2021-07-23 ENCOUNTER — Other Ambulatory Visit: Payer: Self-pay | Admitting: Physician Assistant

## 2021-07-23 DIAGNOSIS — I8002 Phlebitis and thrombophlebitis of superficial vessels of left lower extremity: Secondary | ICD-10-CM

## 2021-07-24 ENCOUNTER — Ambulatory Visit
Admission: RE | Admit: 2021-07-24 | Discharge: 2021-07-24 | Disposition: A | Discharge status: Home / Self Care | Payer: BC Managed Care – PPO | Source: Ambulatory Visit | Attending: Physician Assistant | Admitting: Physician Assistant

## 2021-07-24 DIAGNOSIS — I8002 Phlebitis and thrombophlebitis of superficial vessels of left lower extremity: Secondary | ICD-10-CM

## 2021-07-25 ENCOUNTER — Other Ambulatory Visit: Payer: Self-pay | Admitting: Physician Assistant

## 2021-07-25 DIAGNOSIS — M79621 Pain in right upper arm: Secondary | ICD-10-CM

## 2021-07-25 DIAGNOSIS — M7989 Other specified soft tissue disorders: Secondary | ICD-10-CM

## 2021-08-13 ENCOUNTER — Other Ambulatory Visit: Payer: Self-pay | Admitting: Physician Assistant

## 2021-08-13 ENCOUNTER — Ambulatory Visit
Admission: RE | Admit: 2021-08-13 | Discharge: 2021-08-13 | Disposition: A | Discharge status: Home / Self Care | Payer: BC Managed Care – PPO | Source: Ambulatory Visit | Attending: Physician Assistant | Admitting: Physician Assistant

## 2021-08-13 DIAGNOSIS — N6320 Unspecified lump in the left breast, unspecified quadrant: Secondary | ICD-10-CM

## 2021-08-13 DIAGNOSIS — M7989 Other specified soft tissue disorders: Secondary | ICD-10-CM

## 2021-08-13 DIAGNOSIS — M79621 Pain in right upper arm: Secondary | ICD-10-CM

## 2022-09-02 ENCOUNTER — Other Ambulatory Visit: Payer: Self-pay | Admitting: Physician Assistant

## 2022-09-02 DIAGNOSIS — Z1231 Encounter for screening mammogram for malignant neoplasm of breast: Secondary | ICD-10-CM

## 2022-09-07 ENCOUNTER — Ambulatory Visit
Admission: RE | Admit: 2022-09-07 | Discharge: 2022-09-07 | Disposition: A | Discharge status: Home / Self Care | Payer: PRIVATE HEALTH INSURANCE | Source: Ambulatory Visit | Attending: Physician Assistant | Admitting: Physician Assistant

## 2022-09-07 DIAGNOSIS — Z1231 Encounter for screening mammogram for malignant neoplasm of breast: Secondary | ICD-10-CM

## 2022-10-19 ENCOUNTER — Ambulatory Visit (AMBULATORY_SURGERY_CENTER): Payer: PRIVATE HEALTH INSURANCE | Admitting: *Deleted

## 2022-10-19 VITALS — Ht 67.5 in | Wt 210.0 lb

## 2022-10-19 DIAGNOSIS — Z1211 Encounter for screening for malignant neoplasm of colon: Secondary | ICD-10-CM

## 2022-10-19 MED ORDER — PEG 3350-KCL-NA BICARB-NACL 420 G PO SOLR: 4000.0000 mL | Freq: Once | ORAL | 0 refills | Status: AC

## 2022-10-19 NOTE — Progress Notes (Signed)
No egg or soy allergy known to patient  No issues known to pt with past sedation with any surgeries or procedures Patient denies ever being told they had issues or difficulty with intubation  No FH of Malignant Hyperthermia Pt is not on diet pills Pt is not on  home 02  Pt is not on blood thinners  Pt has issues with constipation at times,extra miralax given to take with prep. No A fib or A flutter Have any cardiac testing pending--no Pt instructed to use Single[employer]com or GoodRx for a price reduction on prep    Patient's chart reviewed by Cathlyn Parsons CNRA prior to previsit and patient appropriate for the [facility].  Previsit completed and red dot placed by patient's name on their procedure day (on provider's schedule).

## 2022-10-22 IMAGING — MG DIGITAL DIAGNOSTIC BILAT W/ TOMO W/ CAD
8 of 18 series · 8 of 40 positions shown · non-contrast
Comparison: Previous exams, most recently November 2016.

CLINICAL DATA: Interval follow-up of a likely benign mass involving
the LEFT breast. New palpable concern in the RIGHT axilla.

EXAM:
DIGITAL DIAGNOSTIC BILATERAL MAMMOGRAM WITH TOMOSYNTHESIS AND CAD;
ULTRASOUND LEFT BREAST LIMITED; US AXILLARY RIGHT
TECHNIQUE: Bilateral digital diagnostic mammography and breast tomosynthesis
was performed. The images were evaluated with computer-aided
detection.; Targeted ultrasound examination of the left breast was
performed.; Targeted ultrasound examination of the right axilla was
performed.

[R CC synth-2D (1 of 2)]
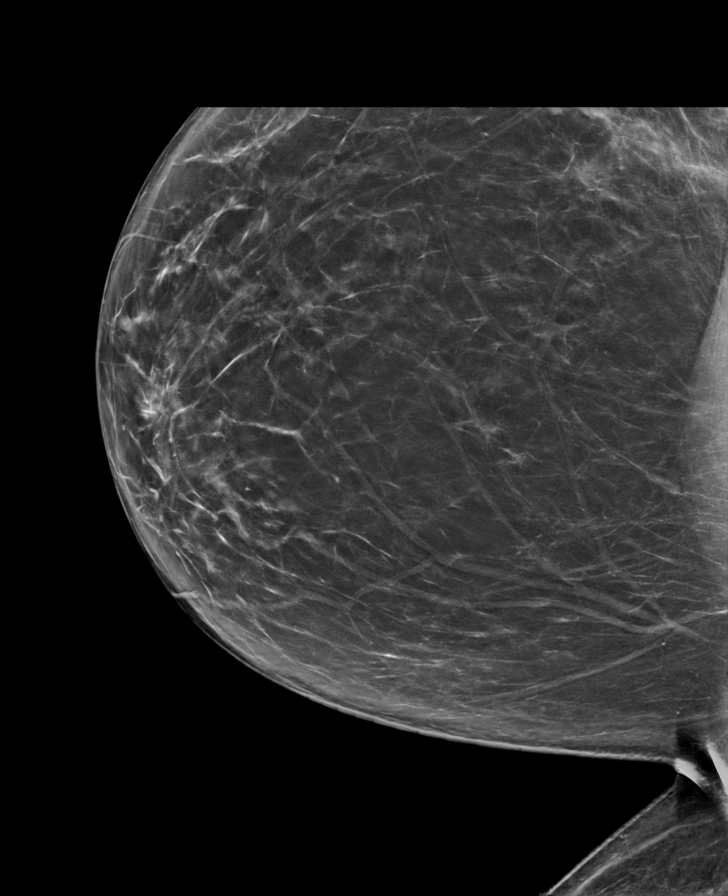

[R MLO synth-2D]
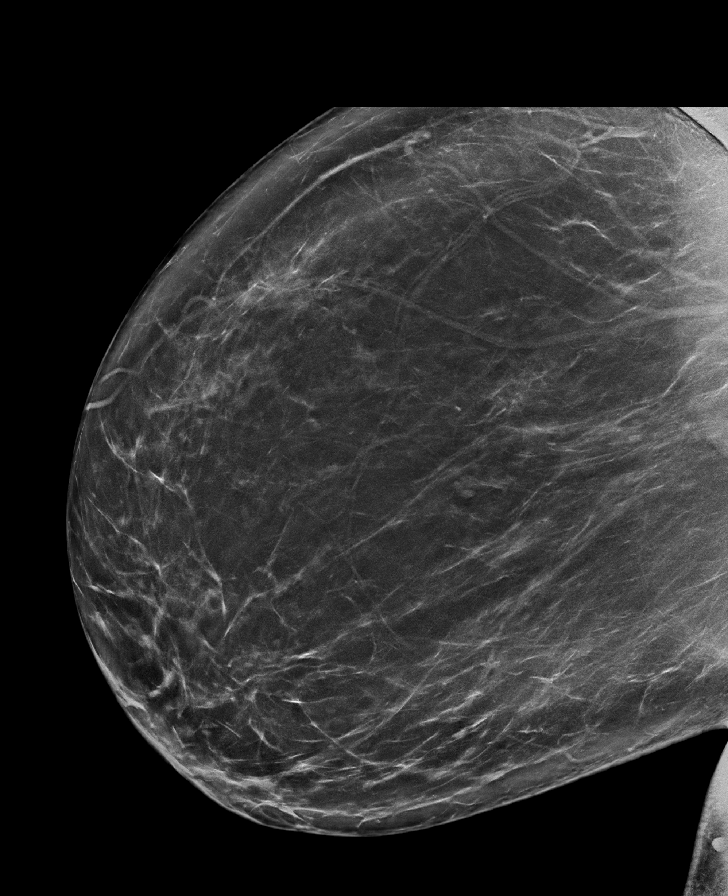

[L MLO synth-2D]
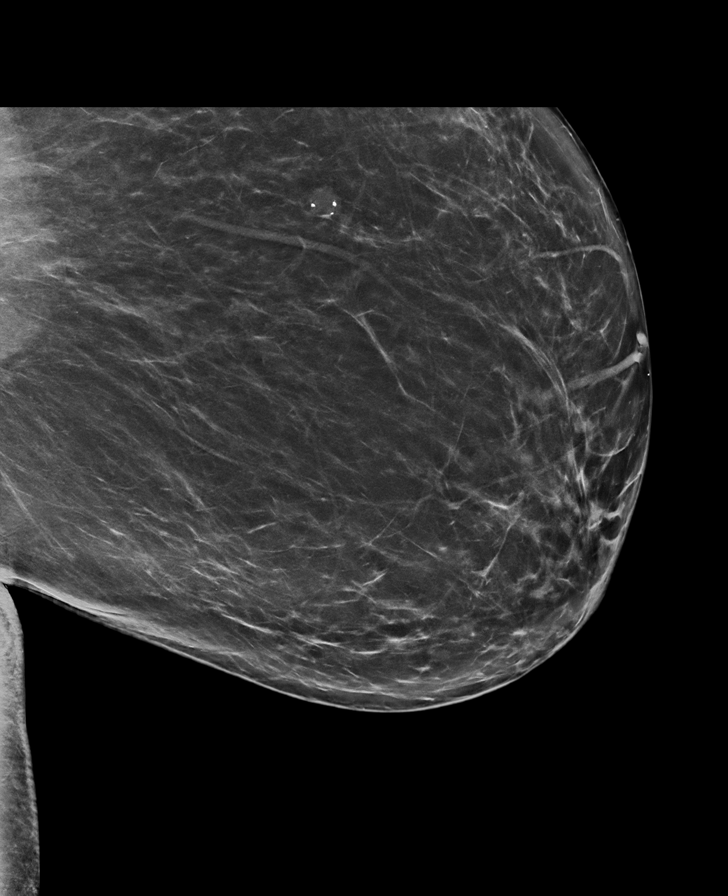

[L CC synth-2D (1 of 3)]
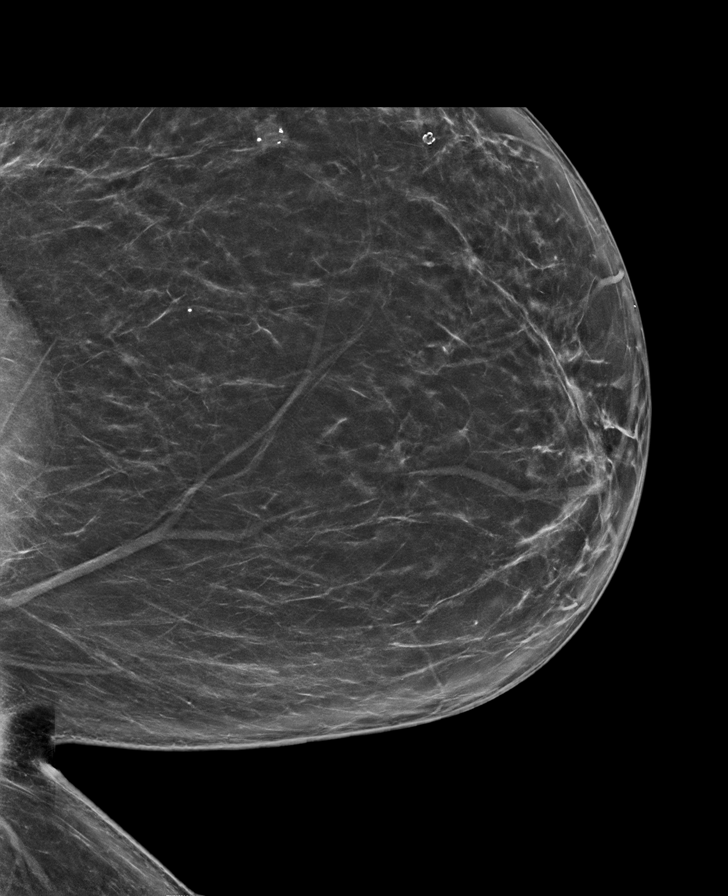

[R CC synth-2D (2 of 2)]
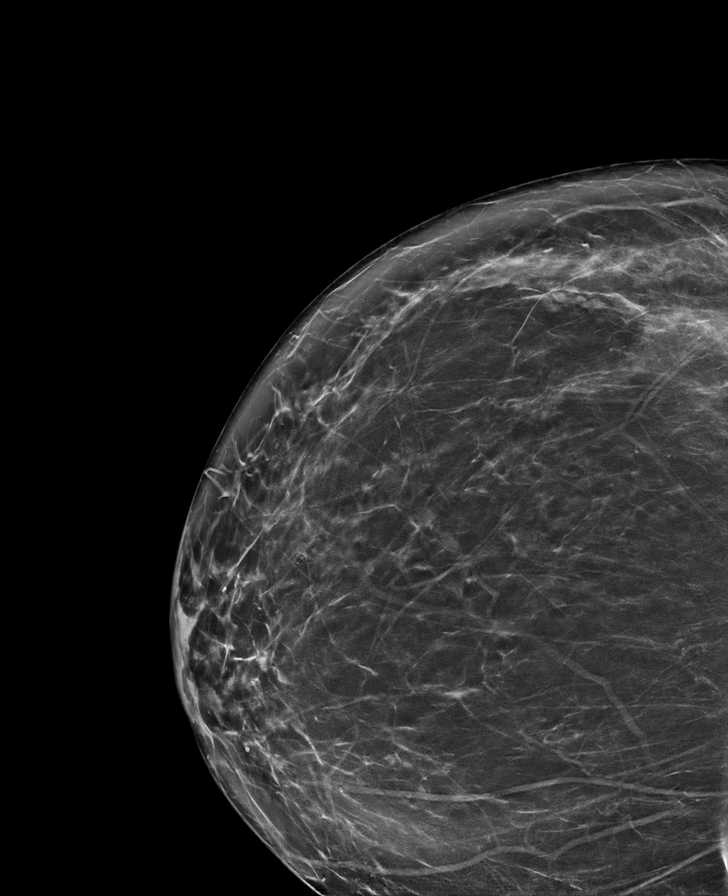

[L CC synth-2D (2 of 3)]
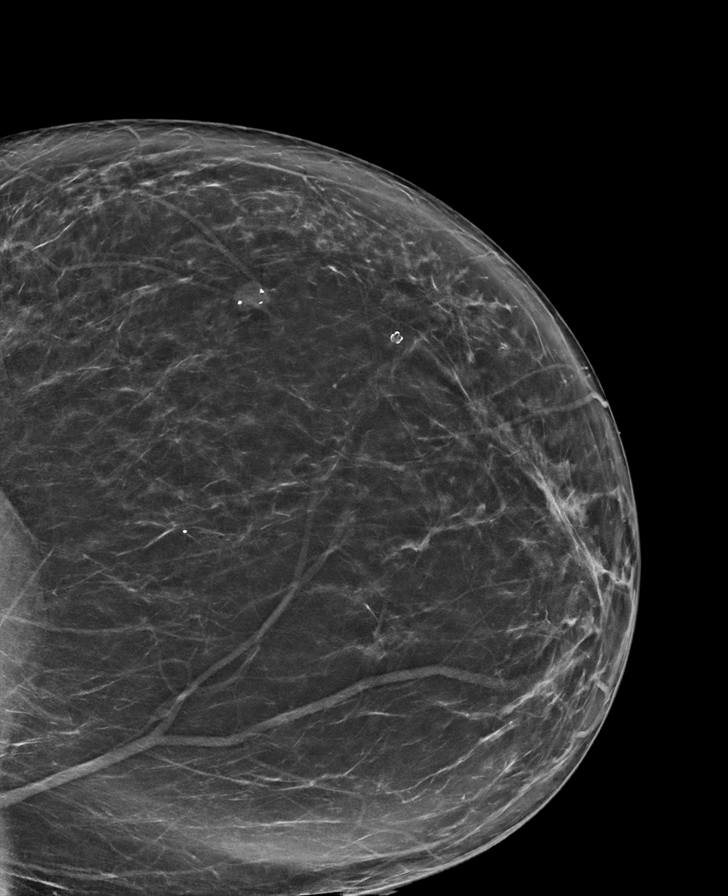

[L CC synth-2D (3 of 3)]
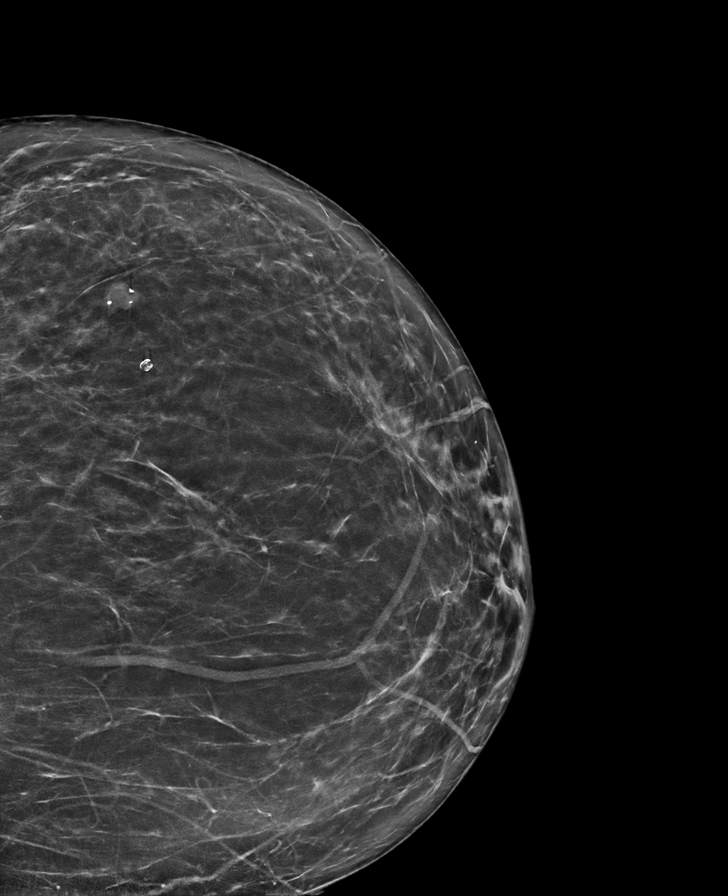

[L CC tomo · tomo slice 41/82.0]
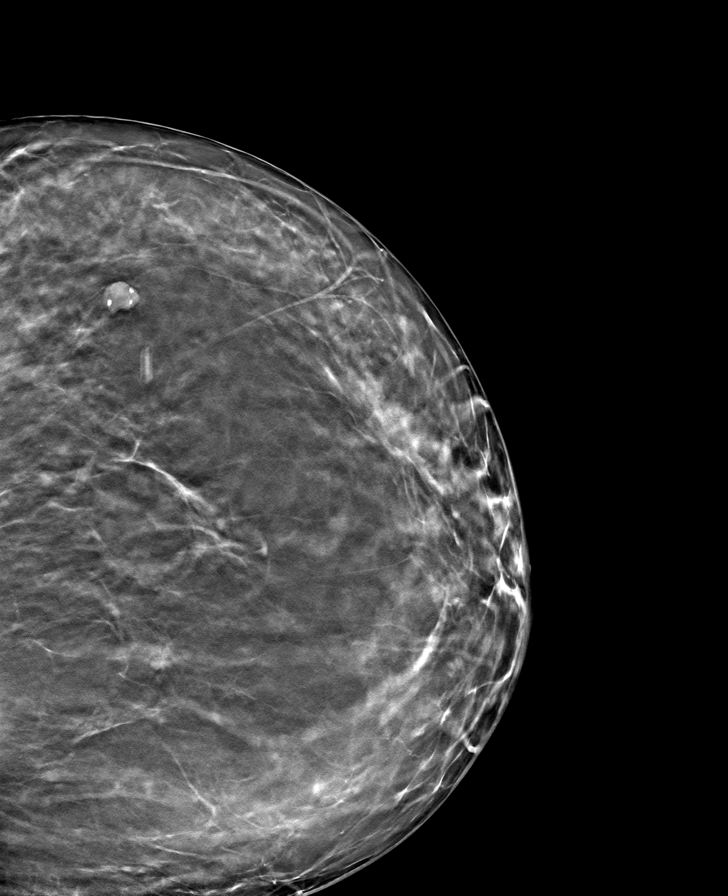

[8 of 40 positions shown; findings below may reference images not displayed]

ACR Breast Density Category b: There are scattered areas of
fibroglandular density.
FINDINGS: Full field CC and MLO views of both breasts and a spot tangential
view of the palpable concern in the RIGHT axilla were obtained.

RIGHT: No findings suspicious for malignancy. Prominent axillary fat
pad in the area of palpable concern. Normal-appearing axillary lymph
nodes are present deep to the area of palpable concern.

RIGHT axillary ultrasound is performed in the area of palpable
concern, demonstrating a slightly axillary fat pad. The visualized
axillary lymph nodes are normal in appearance. There is no mass or
pathologic lymphadenopathy.

LEFT: Isodense mass measuring approximately 1 cm in the UPPER OUTER
QUADRANT, now associated with benign appearing dystrophic
calcifications, unchanged in size since the November 2016 mammogram. No
new or suspicious findings elsewhere.

Targeted ultrasound is performed, demonstrating the previously
identified circumscribed oval parallel hypoechoic mass at 2 o'clock
position 12 cm from nipple at middle to posterior depth measuring
approximately 0.9 x 0.5 x 0.8 cm (previously 1.1 x 0.6 x 0.8 cm)
demonstrating mixed posterior characteristics, containing
calcifications as seen on mammography.
IMPRESSION: 1. No mammographic or sonographic evidence of malignancy involving
the LEFT breast.
2. No mammographic evidence of malignancy involving the RIGHT
breast.
3. Stable 0.9 cm mass in the UPPER OUTER QUADRANT of the LEFT breast
at the 2 o'clock position 12 cm from nipple dating back to 4358,
confirming benignity.
4. No pathologic RIGHT axillary lymphadenopathy.

RECOMMENDATION:
Screening mammogram in one year.(Code:LZ-Z-23Q)

I have discussed the findings and recommendations with the patient.
If applicable, a reminder letter will be sent to the patient
regarding the next appointment.

BI-RADS CATEGORY  2: Benign.

## 2022-11-09 ENCOUNTER — Encounter: Payer: PRIVATE HEALTH INSURANCE | Admitting: Gastroenterology

## 2023-07-17 ENCOUNTER — Other Ambulatory Visit: Payer: Self-pay

## 2023-07-17 ENCOUNTER — Encounter (HOSPITAL_COMMUNITY): Payer: Self-pay

## 2023-07-17 ENCOUNTER — Emergency Department (HOSPITAL_COMMUNITY)
Admission: EM | Admit: 2023-07-17 | Discharge: 2023-07-18 | Disposition: A | Discharge status: Home / Self Care | Payer: No Typology Code available for payment source | Attending: Emergency Medicine | Admitting: Emergency Medicine

## 2023-07-17 ENCOUNTER — Emergency Department (HOSPITAL_COMMUNITY): Payer: No Typology Code available for payment source

## 2023-07-17 DIAGNOSIS — R102 Pelvic and perineal pain: Secondary | ICD-10-CM | POA: Diagnosis not present

## 2023-07-17 DIAGNOSIS — N898 Other specified noninflammatory disorders of vagina: Secondary | ICD-10-CM | POA: Insufficient documentation

## 2023-07-17 DIAGNOSIS — Z7982 Long term (current) use of aspirin: Secondary | ICD-10-CM | POA: Insufficient documentation

## 2023-07-17 LAB — URINALYSIS, ROUTINE W REFLEX MICROSCOPIC
Bilirubin Urine: NEGATIVE
Glucose, UA: NEGATIVE mg/dL
Ketones, ur: NEGATIVE mg/dL
Nitrite: NEGATIVE
Protein, ur: NEGATIVE mg/dL
Specific Gravity, Urine: 1.015 (ref 1.005–1.030)
WBC, UA: >50 WBC/hpf (ref 0–5)
pH: 6 (ref 5.0–8.0)

## 2023-07-17 LAB — WET PREP, GENITAL
Clue Cells Wet Prep HPF POC: NONE SEEN
Sperm: NONE SEEN
Trich, Wet Prep: NONE SEEN
WBC, Wet Prep HPF POC: >10 — AB (ref <10)
Yeast Wet Prep HPF POC: NONE SEEN

## 2023-07-17 LAB — CBC
HCT: 35.9 % — ABNORMAL LOW (ref 36.0–46.0)
Hemoglobin: 11.4 g/dL — ABNORMAL LOW (ref 12.0–15.0)
MCH: 22.9 pg — ABNORMAL LOW (ref 26.0–34.0)
MCHC: 31.8 g/dL (ref 30.0–36.0)
MCV: 72.1 fL — ABNORMAL LOW (ref 80.0–100.0)
Platelets: 283 10*3/uL (ref 150–400)
RBC: 4.98 MIL/uL (ref 3.87–5.11)
RDW: 16.3 % — ABNORMAL HIGH (ref 11.5–15.5)
WBC: 9.4 10*3/uL (ref 4.0–10.5)
nRBC: 0 % (ref 0.0–0.2)

## 2023-07-17 LAB — COMPREHENSIVE METABOLIC PANEL
ALT: 23 U/L (ref 0–44)
AST: 41 U/L (ref 15–41)
Albumin: 3.8 g/dL (ref 3.5–5.0)
Alkaline Phosphatase: 54 U/L (ref 38–126)
Anion gap: 8 (ref 5–15)
BUN: 18 mg/dL (ref 6–20)
CO2: 26 mmol/L (ref 22–32)
Calcium: 9.4 mg/dL (ref 8.9–10.3)
Chloride: 103 mmol/L (ref 98–111)
Creatinine, Ser: 1.12 mg/dL — ABNORMAL HIGH (ref 0.44–1.00)
GFR, Estimated: >60 mL/min (ref >60)
Glucose, Bld: 107 mg/dL — ABNORMAL HIGH (ref 70–99)
Potassium: 3.4 mmol/L — ABNORMAL LOW (ref 3.5–5.1)
Sodium: 137 mmol/L (ref 135–145)
Total Bilirubin: 0.4 mg/dL (ref 0.0–1.2)
Total Protein: 7.3 g/dL (ref 6.5–8.1)

## 2023-07-17 LAB — LIPASE, BLOOD: Lipase: 43 U/L (ref 11–51)

## 2023-07-17 MED ORDER — SODIUM CHLORIDE 0.9 % IV SOLN
1.0000 g | Freq: Once | INTRAVENOUS | Status: AC
Start: 2023-07-17 — End: 2023-07-18
  Administered 2023-07-17: 1 g via INTRAVENOUS
  Filled 2023-07-17: qty 10

## 2023-07-17 MED ORDER — IOHEXOL 300 MG/ML  SOLN
100.0000 mL | Freq: Once | INTRAMUSCULAR | Status: AC | PRN
  Administered 2023-07-17: 100 mL via INTRAVENOUS

## 2023-07-17 MED ORDER — KETOROLAC TROMETHAMINE 15 MG/ML IJ SOLN
15.0000 mg | Freq: Once | INTRAMUSCULAR | Status: AC
  Administered 2023-07-17: 15 mg via INTRAVENOUS
  Filled 2023-07-17: qty 1

## 2023-07-17 NOTE — ED Triage Notes (Signed)
 Pt reports:  Groin Pain (R) Started Dec 14 Off and on [facility] Vaginal Discharge Color: pink Started about 1 hour ago  Hx of partial hysterectomy Dec 14

## 2023-07-17 NOTE — ED Provider Notes (Signed)
 Care of patient assumed from [facility].  This patient presents with recent onset of vaginal discharge in addition to ongoing right groin pain for the past couple weeks.  Pelvic exam notable for purulent discharge.  CT scan pending. Physical Exam  BP 137/89   Pulse 81   Temp 98.6 F (37 C) (Oral)   Resp 16   Ht 5' 7 (1.702 m)   Wt 95.3 kg   LMP 04/11/2017 (Approximate) Comment: continuous period for 6 months  SpO2 99%   BMI 32.89 kg/m   Physical Exam Vitals and nursing note reviewed.  Constitutional:      General: She is not in acute distress.    Appearance: Normal appearance. She is well-developed. She is not ill-appearing, toxic-appearing or diaphoretic.  HENT:     Head: Normocephalic and atraumatic.     Right Ear: External ear normal.     Left Ear: External ear normal.     Nose: Nose normal.     Mouth/Throat:     Mouth: Mucous membranes are moist.  Eyes:     Extraocular Movements: Extraocular movements intact.     Conjunctiva/sclera: Conjunctivae normal.  Cardiovascular:     Rate and Rhythm: Normal rate and regular rhythm.  Pulmonary:     Effort: Pulmonary effort is normal. No respiratory distress.  Abdominal:     General: There is no distension.     Palpations: Abdomen is soft.  Musculoskeletal:        General: No swelling. Normal range of motion.     Cervical back: Normal range of motion and neck supple.  Skin:    General: Skin is warm and dry.     Coloration: Skin is not jaundiced or pale.  Neurological:     General: No focal deficit present.     Mental Status: She is alert and oriented to person, place, and time.  Psychiatric:        Mood and Affect: Mood normal.        Behavior: Behavior normal.     Procedures  Procedures  ED Course / MDM    Medical Decision Making Amount and/or Complexity of Data Reviewed Labs: ordered. Radiology: ordered.  Risk Prescription drug management.   On assessment, patient resting comfortably.  CT scan did not show  any acute intra-abdominal findings.  Will treat for cervicitis.  She received ceftriaxone  while in the ED.  Doxycycline  was prescribed.  Patient to follow-up with her gynecologist, Dr. Jayne.  She was discharged in stable condition.       Melvenia Motto, MD 07/18/23 [phone]

## 2023-07-17 NOTE — ED Notes (Signed)
 Patient transported to CT

## 2023-07-17 NOTE — ED Provider Notes (Addendum)
[place] EMERGENCY DEPARTMENT AT [facility] Provider Note   CSN: [id number] Arrival date & time: 07/17/23  2111     History  Chief Complaint  Patient presents with   Vaginal Discharge   Groin Pain    Chloe Marks is a 48 y.o. female.  She presents for evaluation today of vaginal discharge.  She had supracervical hysterectomy and RSO in 2019, has not had any difficulty since then.  Presented today because she started having some sharp right groin pain, had light pink vaginal discharge and then had what looked like a chunk of meat, come out of her vagina.   Vaginal Discharge Groin Pain       Home Medications Prior to Admission medications   Medication Sig Start Date End Date Taking? Authorizing Provider  aspirin  EC 81 MG tablet Take 81 mg by mouth daily.    [provider]  cetirizine (ZYRTEC) 10 MG tablet Take 10 mg by mouth daily as needed for allergies.     [provider]  cyclobenzaprine  (FLEXERIL ) 10 MG tablet Take 1 tablet (10 mg total) by mouth 3 (three) times daily. Patient taking differently: Take 10 mg by mouth daily as needed for muscle spasms.  01/24/18   Armida Culver, PA-C  docusate sodium  (COLACE) 100 MG capsule Take 100 mg by mouth daily.    [provider]  escitalopram  (LEXAPRO ) 10 MG tablet Take 20 mg by mouth daily.    [provider]  ferrous sulfate 325 (65 FE) MG EC tablet Take 325 mg by mouth 2 (two) times daily.    [provider]  hydrOXYzine (ATARAX) 25 MG tablet Take 25 mg by mouth 3 (three) times daily as needed.    [provider]  ketorolac  (TORADOL ) 10 MG tablet Take 1 tablet (10 mg total) by mouth every 8 (eight) hours as needed. 12/23/17   Jayne Vonn DEL, MD  losartan-hydrochlorothiazide  (HYZAAR) 50-12.5 MG tablet Take 1 tablet by mouth daily. 05/19/19   [provider]  naproxen sodium (ANAPROX) 550 MG tablet SMARTSIG:1 Tablet(s) By Mouth Every 12 Hours PRN 06/13/19    [provider]      Allergies    Lisinopril  and Vicodin [hydrocodone -acetaminophen ]    Review of Systems   Review of Systems  Genitourinary:  Positive for vaginal discharge.    Physical Exam Updated Vital Signs BP 137/89   Pulse 81   Temp 98.6 F (37 C) (Oral)   Resp 16   Ht 5' 7 (1.702 m)   Wt 95.3 kg   LMP 04/11/2017 (Approximate) Comment: continuous period for 6 months  SpO2 99%   BMI 32.89 kg/m  Physical Exam Vitals and nursing note reviewed. Exam conducted with a chaperone present (chaperoned by ED tech).  Constitutional:      General: She is not in acute distress.    Appearance: She is well-developed.  HENT:     Head: Normocephalic and atraumatic.  Eyes:     Conjunctiva/sclera: Conjunctivae normal.  Cardiovascular:     Rate and Rhythm: Normal rate and regular rhythm.     Heart sounds: No murmur heard. Pulmonary:     Effort: Pulmonary effort is normal. No respiratory distress.     Breath sounds: Normal breath sounds.  Abdominal:     Palpations: Abdomen is soft.     Tenderness: There is no abdominal tenderness.  Genitourinary:    General: Normal vulva.     Vagina: No foreign body. No tenderness, bleeding or  lesions.     Cervix: Cervical motion tenderness, discharge and erythema present.     Uterus: Absent.   Musculoskeletal:        General: No swelling.     Cervical back: Neck supple.  Skin:    General: Skin is warm and dry.     Capillary Refill: Capillary refill takes less than 2 seconds.  Neurological:     Mental Status: She is alert.  Psychiatric:        Mood and Affect: Mood normal.     ED Results / Procedures / Treatments   Labs (all labs ordered are listed, but only abnormal results are displayed) Labs Reviewed  WET PREP, GENITAL - Abnormal; Notable for the following components:      Result Value   WBC, Wet Prep HPF POC >10 (*)    All other components within normal limits  COMPREHENSIVE METABOLIC PANEL - Abnormal; Notable for  the following components:   Potassium 3.4 (*)    Glucose, Bld 107 (*)    Creatinine, Ser 1.12 (*)    All other components within normal limits  CBC - Abnormal; Notable for the following components:   Hemoglobin 11.4 (*)    HCT 35.9 (*)    MCV 72.1 (*)    MCH 22.9 (*)    RDW 16.3 (*)    All other components within normal limits  URINALYSIS, ROUTINE W REFLEX MICROSCOPIC - Abnormal; Notable for the following components:   APPearance HAZY (*)    Hgb urine dipstick LARGE (*)    Leukocytes,Ua LARGE (*)    Bacteria, UA RARE (*)    All other components within normal limits  LIPASE, BLOOD  GC/CHLAMYDIA PROBE AMP ([place]) NOT AT [facility]    EKG None  Radiology No results found.  Procedures Procedures    Medications Ordered in ED Medications  ketorolac  (TORADOL ) 15 MG/ML injection 15 mg (has no administration in time range)  cefTRIAXone  (ROCEPHIN ) 1 g in sodium chloride  0.9 % 100 mL IVPB (has no administration in time range)  iohexol  (OMNIPAQUE ) 300 MG/ML solution 100 mL (100 mLs Intravenous Contrast Given 07/17/23 2329)    ED Course/ Medical Decision Making/ A&P                                 Medical Decision Making DDx: Cervicitis, PID, appendicitis, other  ED course: Patient with right lower quadrant/pelvic pain and vaginal discharge, pelvic exam shows green discharge mixed with pink color discharge, wet prep shows large amount of white blood cells but no clue cells, trichomonas, or yeast  Labs otherwise reassuring but patient having quite a lot of right lower quadrant tenderness so will obtain CT.  Empirically given Rocephin , will plan to discharge on doxycycline  if CT normal.  Results pending signed out to Dr. Melvenia to follow-up on CT.  Patient given Toradol  for pain.  She is well-appearing.  Amount and/or Complexity of Data Reviewed Labs: ordered. Radiology: ordered.  Risk Prescription drug management.           Final Clinical Impression(s) / ED  Diagnoses Final diagnoses:  None    Rx / DC Orders ED Discharge Orders     None         Suellen Sherran DELENA DEVONNA 07/17/23 2314    Francesca Elsie CROME, MD 07/17/23 2329    Suellen Sherran A, PA-C 07/17/23 2334    Francesca Elsie CROME, MD 07/19/23  1535  

## 2023-07-18 MED ORDER — DOXYCYCLINE HYCLATE 100 MG PO CAPS: 100.0000 mg | ORAL_CAPSULE | Freq: Two times a day (BID) | ORAL | 0 refills | Status: AC

## 2023-07-18 NOTE — ED Notes (Signed)
Patient verbalizes understanding of discharge instructions. Opportunity for questioning and answers were provided. Armband removed by staff, pt discharged from ED. Ambulated out to lobby with husband

## 2023-07-18 NOTE — Discharge Instructions (Addendum)
 Test results today are reassuring.  You were given antibiotics for treatment of infection of your cervix.  An additional antibiotic was sent to your pharmacy.  Take as prescribed for the next week.  Follow-up with Dr. Jayne.  Return to the emergency department for any new or worsening symptoms of concern.

## 2023-07-19 LAB — GC/CHLAMYDIA PROBE AMP (~~LOC~~) NOT AT ARMC
Chlamydia: NEGATIVE
Comment: NEGATIVE
Comment: NORMAL
Neisseria Gonorrhea: NEGATIVE

## 2023-08-06 ENCOUNTER — Ambulatory Visit (INDEPENDENT_AMBULATORY_CARE_PROVIDER_SITE_OTHER): Payer: No Typology Code available for payment source | Admitting: Obstetrics & Gynecology

## 2023-08-06 ENCOUNTER — Encounter: Payer: Self-pay | Admitting: Obstetrics & Gynecology

## 2023-08-06 ENCOUNTER — Other Ambulatory Visit (HOSPITAL_COMMUNITY)
Admission: RE | Admit: 2023-08-06 | Discharge: 2023-08-06 | Disposition: A | Discharge status: Home / Self Care | Payer: No Typology Code available for payment source | Source: Ambulatory Visit | Attending: Obstetrics & Gynecology | Admitting: Obstetrics & Gynecology

## 2023-08-06 VITALS — BP 172/101 | HR 76 | Ht 67.0 in | Wt 209.0 lb

## 2023-08-06 DIAGNOSIS — Z124 Encounter for screening for malignant neoplasm of cervix: Secondary | ICD-10-CM | POA: Insufficient documentation

## 2023-08-06 DIAGNOSIS — Z01419 Encounter for gynecological examination (general) (routine) without abnormal findings: Secondary | ICD-10-CM | POA: Insufficient documentation

## 2023-08-06 NOTE — Addendum Note (Signed)
Addended by: Moss Mc on: 08/06/2023 11:34 AM   Modules accepted: Orders

## 2023-08-06 NOTE — Progress Notes (Signed)
Subjective:     Chloe Marks is a 48 y.o. female here for a routine exam.  Patient's last menstrual period was 04/11/2017 (approximate). [id number] Birth Control Method:  hysterectomy Menstrual Calendar(currently): amenorrheic  Current complaints: none.   Current acute medical issues:  none   Recent Gynecologic History Patient's last menstrual period was 04/11/2017 (approximate). 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; has lesion of left side of brain    Past Surgical History:  Procedure Laterality Date   CHOLECYSTECTOMY     DILITATION & CURRETTAGE/HYSTROSCOPY WITH NOVASURE ABLATION N/A 03/24/2017   Procedure: DILATATION & CURETTAGE/HYSTEROSCOPY WITH NOVASURE ABLATION;  Surgeon: Lazaro Arms, MD;  Location: [facility];  Service: Gynecology;  Laterality: N/A;   SALPINGOOPHORECTOMY N/A 12/15/2017   Procedure: RIGHT SALPINGO OOPHORECTOMY;  Surgeon: Lazaro Arms, MD;  Location: [facility];  Service: Gynecology;  Laterality: N/A;   SUPRACERVICAL ABDOMINAL HYSTERECTOMY N/A 12/15/2017   Procedure: HYSTERECTOMY SUPRACERVICAL ABDOMINAL;  Surgeon: Lazaro Arms, MD;  Location: [facility];  Service: Gynecology;  Laterality: N/A;   TUBAL LIGATION     UNILATERAL SALPINGECTOMY N/A 12/15/2017   Procedure: LEFT SALPINGECTOMY;  Surgeon: Lazaro Arms, MD;  Location: [facility];  Service: Gynecology;  Laterality: N/A;    OB History     Gravida  3   Para  2   Term  2   Preterm      AB  1   Living  2      SAB  1   IAB      Ectopic      Multiple      Live Births  2           Social History   Socioeconomic History   Marital status: Married    Spouse name: Not on file   Number of  children: 2   Years of education: some college   Highest education level: Not on file  Occupational History   Occupation: CMA  Tobacco Use   Smoking status: Every Day    Current packs/day: 0.00    Average packs/day: 0.3 packs/day for 4.0 years (1.0 ttl pk-yrs)    Types: Cigarettes    Start date: 03/22/2010    Last attempt to quit: 03/22/2014    Years since quitting: 9.3   Smokeless tobacco: Never  Vaping Use   Vaping status: Some Days  Substance and Sexual Activity   Alcohol use: No   Drug use: No   Sexual activity: Yes    Birth control/protection: Surgical    Comment: tubal/hyst  Other Topics Concern   Not on file  Social History Narrative   Lives at home with her husband.   One cup coffee, maybe cup of soda per day.   Right-handed.   Social Drivers of Corporate investment banker Strain: Not on file  Food Insecurity: Low Risk  (03/07/2023)   Received from [facility]   Hunger Vital Sign    Worried About Running Out of Food in the Last Year: Never true    Ran  Out of Food in the Last Year: Never true  Transportation Needs: No Transportation Needs (03/07/2023)   Received from [facility]   Transportation    In the past 12 months, has lack of reliable transportation kept you from medical appointments, meetings, work or from getting things needed for daily living? : No  Physical Activity: Not on file  Stress: Not on file  Social Connections: Not on file    Family History  Problem Relation Age of Onset   Other Paternal Grandmother        heart issues   Stomach cancer Father    Hypertension Mother    Colon polyps Brother    Hypertension Brother    Diabetes Brother    Stroke Brother    Heart failure Brother 55   Non-Hodgkin's lymphoma Sister    Fibroids Sister    Esophageal cancer Maternal Uncle    Breast cancer Neg Hx    Colon cancer Neg Hx    Crohn's disease Neg Hx    Rectal cancer Neg Hx    Uterine cancer Neg Hx      Current Outpatient Medications:     ALPRAZolam (XANAX) 0.25 MG tablet, Take 0.25 mg by mouth at bedtime as needed., Disp: , Rfl:    aspirin EC 81 MG tablet, Take 81 mg by mouth daily., Disp: , Rfl:    cetirizine (ZYRTEC) 10 MG tablet, Take 10 mg by mouth daily as needed for allergies. , Disp: , Rfl:    docusate sodium (COLACE) 100 MG capsule, Take 100 mg by mouth daily., Disp: , Rfl:    ferrous sulfate 325 (65 FE) MG EC tablet, Take 325 mg by mouth 2 (two) times daily., Disp: , Rfl:    hydrOXYzine (ATARAX) 25 MG tablet, Take 25 mg by mouth 3 (three) times daily as needed., Disp: , Rfl:    losartan-hydrochlorothiazide (HYZAAR) 50-12.5 MG tablet, Take 1 tablet by mouth daily., Disp: , Rfl:    naproxen sodium (ANAPROX) 550 MG tablet, SMARTSIG:1 Tablet(s) By Mouth Every 12 Hours PRN, Disp: , Rfl:   Review of Systems  Review of Systems  Constitutional: Negative for fever, chills, weight loss, malaise/fatigue and diaphoresis.  HENT: Negative for hearing loss, ear pain, nosebleeds, congestion, sore throat, neck pain, tinnitus and ear discharge.   Eyes: Negative for blurred vision, double vision, photophobia, pain, discharge and redness.  Respiratory: Negative for cough, hemoptysis, sputum production, shortness of breath, wheezing and stridor.   Cardiovascular: Negative for chest pain, palpitations, orthopnea, claudication, leg swelling and PND.  Gastrointestinal: negative for abdominal pain. Negative for heartburn, nausea, vomiting, diarrhea, constipation, blood in stool and melena.  Genitourinary: Negative for dysuria, urgency, frequency, hematuria and flank pain.  Musculoskeletal: Negative for myalgias, back pain, joint pain and falls.  Skin: Negative for itching and rash.  Neurological: Negative for dizziness, tingling, tremors, sensory change, speech change, focal weakness, seizures, loss of consciousness, weakness and headaches.  Endo/Heme/Allergies: Negative for environmental allergies and polydipsia. Does not bruise/bleed easily.   Psychiatric/Behavioral: Negative for depression, suicidal ideas, hallucinations, memory loss and substance abuse. The patient is not nervous/anxious and does not have insomnia.        Objective:  Blood pressure (!) 172/101, pulse 76, height 5\' 7"  (1.702 m), weight 209 lb (94.8 kg), last menstrual period 04/11/2017.   Physical Exam  Vitals reviewed. Constitutional: She is oriented to person, place, and time. She appears well-developed and well-nourished.  HENT:  Head: Normocephalic and atraumatic.  Right Ear: External ear normal.  Left Ear: External ear normal.  Nose: Nose normal.  Mouth/Throat: Oropharynx is clear and moist.  Eyes: Conjunctivae and EOM are normal. Pupils are equal, round, and reactive to light. Right eye exhibits no discharge. Left eye exhibits no discharge. No scleral icterus.  Neck: Normal range of motion. Neck supple. No tracheal deviation present. No thyromegaly present.  Cardiovascular: Normal rate, regular rhythm, normal heart sounds and intact distal pulses.  Exam reveals no gallop and no friction rub.   No murmur heard. Respiratory: Effort normal and breath sounds normal. No respiratory distress. She has no wheezes. She has no rales. She exhibits no tenderness.  GI: Soft. Bowel sounds are normal. She exhibits no distension and no mass. There is no tenderness. There is no rebound and no guarding.  Genitourinary:  Breasts no masses skin changes or nipple changes bilaterally      Vulva is normal without lesions Vagina is pink moist without discharge Cervix normal in appearance and pap is done Uterus is normal size shape and contour Adnexa is negative with normal sized ovaries   Musculoskeletal: Normal range of motion. She exhibits no edema and no tenderness.  Neurological: She is alert and oriented to person, place, and time. She has normal reflexes. She displays normal reflexes. No cranial nerve deficit. She exhibits normal muscle tone. Coordination  normal.  Skin: Skin is warm and dry. No rash noted. No erythema. No pallor.  Psychiatric: She has a normal mood and affect. Her behavior is normal. Judgment and thought content normal.       Medications Ordered at today's visit: No orders of the defined types were placed in this encounter.   Other orders placed at today's visit: No orders of the defined types were placed in this encounter.    ASSESSMENT + PLAN:    ICD-10-CM   1. Well woman exam with routine gynecological exam  Z01.419           No follow-ups on file.

## 2023-08-10 ENCOUNTER — Encounter: Payer: Self-pay | Admitting: Obstetrics & Gynecology

## 2023-08-10 LAB — CYTOLOGY - PAP
Comment: NEGATIVE
Diagnosis: NEGATIVE
High risk HPV: NEGATIVE

## 2023-08-17 ENCOUNTER — Other Ambulatory Visit: Payer: Self-pay | Admitting: Family Medicine

## 2023-08-17 DIAGNOSIS — Z1231 Encounter for screening mammogram for malignant neoplasm of breast: Secondary | ICD-10-CM

## 2023-09-07 DIAGNOSIS — Z1231 Encounter for screening mammogram for malignant neoplasm of breast: Secondary | ICD-10-CM

## 2023-09-16 ENCOUNTER — Ambulatory Visit
Admission: RE | Admit: 2023-09-16 | Discharge: 2023-09-16 | Disposition: A | Discharge status: Home / Self Care | Payer: No Typology Code available for payment source | Source: Ambulatory Visit

## 2023-09-16 DIAGNOSIS — Z1231 Encounter for screening mammogram for malignant neoplasm of breast: Secondary | ICD-10-CM

## 2024-01-18 ENCOUNTER — Emergency Department (HOSPITAL_COMMUNITY)
Admission: EM | Admit: 2024-01-18 | Discharge: 2024-01-18 | Disposition: A | Discharge status: Home / Self Care | Source: Ambulatory Visit | Attending: Emergency Medicine | Admitting: Emergency Medicine

## 2024-01-18 ENCOUNTER — Emergency Department (HOSPITAL_COMMUNITY)

## 2024-01-18 ENCOUNTER — Encounter (HOSPITAL_COMMUNITY): Payer: Self-pay | Admitting: Emergency Medicine

## 2024-01-18 ENCOUNTER — Other Ambulatory Visit: Payer: Self-pay

## 2024-01-18 DIAGNOSIS — I10 Essential (primary) hypertension: Secondary | ICD-10-CM | POA: Diagnosis not present

## 2024-01-18 DIAGNOSIS — R0789 Other chest pain: Secondary | ICD-10-CM | POA: Diagnosis present

## 2024-01-18 DIAGNOSIS — Z79899 Other long term (current) drug therapy: Secondary | ICD-10-CM | POA: Diagnosis not present

## 2024-01-18 DIAGNOSIS — Z7982 Long term (current) use of aspirin: Secondary | ICD-10-CM | POA: Diagnosis not present

## 2024-01-18 DIAGNOSIS — E876 Hypokalemia: Secondary | ICD-10-CM | POA: Diagnosis not present

## 2024-01-18 LAB — BASIC METABOLIC PANEL WITH GFR
Anion gap: 7 (ref 5–15)
BUN: 16 mg/dL (ref 6–20)
CO2: 27 mmol/L (ref 22–32)
Calcium: 9.2 mg/dL (ref 8.9–10.3)
Chloride: 105 mmol/L (ref 98–111)
Creatinine, Ser: 1.18 mg/dL — ABNORMAL HIGH (ref 0.44–1.00)
GFR, Estimated: 57 mL/min — ABNORMAL LOW (ref >60)
Glucose, Bld: 96 mg/dL (ref 70–99)
Potassium: 3.4 mmol/L — ABNORMAL LOW (ref 3.5–5.1)
Sodium: 139 mmol/L (ref 135–145)

## 2024-01-18 LAB — CBC
HCT: 37.5 % (ref 36.0–46.0)
Hemoglobin: 11.5 g/dL — ABNORMAL LOW (ref 12.0–15.0)
MCH: 22.5 pg — ABNORMAL LOW (ref 26.0–34.0)
MCHC: 30.7 g/dL (ref 30.0–36.0)
MCV: 73.5 fL — ABNORMAL LOW (ref 80.0–100.0)
Platelets: 303 K/uL (ref 150–400)
RBC: 5.1 MIL/uL (ref 3.87–5.11)
RDW: 15.9 % — ABNORMAL HIGH (ref 11.5–15.5)
WBC: 6.3 K/uL (ref 4.0–10.5)
nRBC: 0 % (ref 0.0–0.2)

## 2024-01-18 LAB — D-DIMER, QUANTITATIVE: D-Dimer, Quant: <0.27 ug{FEU}/mL (ref 0.00–0.50)

## 2024-01-18 LAB — TROPONIN I (HIGH SENSITIVITY)
Troponin I (High Sensitivity): 2 ng/L (ref <18)
Troponin I (High Sensitivity): 3 ng/L (ref <18)

## 2024-01-18 MED ORDER — METHOCARBAMOL 500 MG PO TABS: 500.0000 mg | ORAL_TABLET | Freq: Three times a day (TID) | ORAL | 0 refills | Status: AC | PRN

## 2024-01-18 MED ORDER — KETOROLAC TROMETHAMINE 15 MG/ML IJ SOLN
15.0000 mg | Freq: Once | INTRAMUSCULAR | Status: AC
  Administered 2024-01-18: 15 mg via INTRAVENOUS
  Filled 2024-01-18: qty 1

## 2024-01-18 MED ORDER — IOHEXOL 350 MG/ML SOLN
75.0000 mL | Freq: Once | INTRAVENOUS | Status: AC | PRN
  Administered 2024-01-18: 75 mL via INTRAVENOUS

## 2024-01-18 MED ORDER — FENTANYL CITRATE PF 50 MCG/ML IJ SOSY
50.0000 ug | PREFILLED_SYRINGE | Freq: Once | INTRAMUSCULAR | Status: AC
  Administered 2024-01-18: 50 ug via INTRAVENOUS
  Filled 2024-01-18: qty 1

## 2024-01-18 NOTE — ED Notes (Signed)
 Pt transported back from CT.

## 2024-01-18 NOTE — ED Notes (Signed)
 Pt transported to CT ?

## 2024-01-18 NOTE — ED Provider Notes (Signed)
 [place] EMERGENCY DEPARTMENT AT [facility] Provider Note   CSN: [id number] Arrival date & time: 01/18/24  1424     Patient presents with: Chest Pain   Chloe Marks is a 48 y.o. female.    Chest Pain Patient presents with chest pain.  Left chest.  Began this morning.  Worse with certain positions.  Worse with breathing.  Does not feel short of breath.  No swelling in her legs.  Does have some radiation down the left arm.  No rash.  No fevers.  No cough.  Does have some radiation to the back.    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 Authorizing Provider  ASHWAGANDHA GUMMIES PO Take 2 tablets by mouth every other day. [place] gummies   Yes [provider]  aspirin  EC 81 MG tablet Take 81 mg by mouth daily.   Yes [provider]  docusate sodium  (COLACE) 100 MG capsule Take 100 mg by mouth daily.   Yes [provider]  ferrous sulfate 325 (65 FE) MG EC tablet Take 325 mg by mouth 2 (two) times daily.   Yes [provider]  losartan-hydrochlorothiazide  (HYZAAR) 100-12.5 MG tablet Take 1 tablet by mouth daily.   Yes [provider]  methocarbamol  (ROBAXIN ) 500 MG tablet Take 1 tablet (500 mg total) by mouth every 8 (eight) hours as needed. 01/18/24  Yes Patsey Lot, MD    Allergies: Vicodin [hydrocodone -acetaminophen ] and Lisinopril     Review of Systems  Cardiovascular:  Positive for chest pain.    Updated Vital Signs BP 137/69   Pulse 70   Temp 98 F (36.7 C) (Oral)   Resp 17   Ht 5' 7 (1.702 m)   Wt 96.2 kg   LMP 04/11/2017 (Approximate) Comment:  continuous period for 6 months  SpO2 99%   BMI 33.22 kg/m   Physical Exam Vitals and nursing note reviewed.  Cardiovascular:     Rate and Rhythm: Regular rhythm.  Pulmonary:     Breath sounds: No wheezing.  Chest:     Chest wall: Tenderness present.     Comments: Tenderness to left anterior chest wall.  No crepitance or deformity.  No rash. Abdominal:     Tenderness: There is no abdominal tenderness.  Musculoskeletal:     Right lower leg: No edema.     Left lower leg: No edema.  Skin:    General: Skin is warm.  Neurological:     Mental Status: She is alert.     (all labs ordered are listed, but only abnormal results are displayed) Labs Reviewed  BASIC METABOLIC PANEL WITH GFR - Abnormal; Notable for the following components:      Result Value   Potassium 3.4 (*)    Creatinine, Ser 1.18 (*)    GFR, Estimated 57 (*)    All other components within normal limits  CBC - Abnormal; Notable for the following components:   Hemoglobin 11.5 (*)    MCV 73.5 (*)  MCH 22.5 (*)    RDW 15.9 (*)    All other components within normal limits  D-DIMER, QUANTITATIVE  TROPONIN I (HIGH SENSITIVITY)  TROPONIN I (HIGH SENSITIVITY)    EKG: EKG Interpretation Date/Time:  Tuesday January 18 2024 14:45:15 EDT Ventricular Rate:  70 PR Interval:  181 QRS Duration:  110 QT Interval:  395 QTC Calculation: 427 R Axis:   26  Text Interpretation: Sinus rhythm Consider left atrial enlargement Confirmed by Patsey Lot [phone]) on 01/18/2024 2:54:08 PM  Radiology: CT Angio Chest Aorta W and/or Wo Contrast Result Date: 01/18/2024 CLINICAL DATA:  Left-sided chest pain radiating down the left arm EXAM: CT ANGIOGRAPHY CHEST WITH CONTRAST TECHNIQUE: Multidetector CT imaging of the chest was performed using the standard protocol during bolus administration of intravenous contrast. Multiplanar CT image reconstructions and MIPs were obtained to evaluate the vascular anatomy. RADIATION DOSE REDUCTION:  This exam was performed according to the departmental dose-optimization program which includes automated exposure control, adjustment of the mA and/or kV according to patient size and/or use of iterative reconstruction technique. CONTRAST:  75mL OMNIPAQUE  IOHEXOL  350 MG/ML SOLN COMPARISON:  Same day chest radiograph, cardiac CT dated 12/05/2019 FINDINGS: Cardiovascular: Preferential opacification of the thoracic aorta. No evidence of thoracic aortic aneurysm or dissection. Normal heart size. No pericardial effusion. No central pulmonary embolism. Mediastinum/Nodes: Imaged thyroid gland without nodules meeting criteria for imaging follow-up by size. Normal esophagus. No pathologically enlarged axillary, supraclavicular, mediastinal, or hilar lymph nodes. Lungs/Pleura: The central airways are patent. No focal consolidation. Unchanged 2 mm perifissural right lower lobe nodule (8:67), likely benign. No specific follow-up imaging recommended. No pneumothorax. No pleural effusion. Upper abdomen: Cholecystectomy. Partially imaged radiodensity within the left renal collecting system, which may reflect excreted contrast material. Musculoskeletal: No acute or abnormal lytic or blastic osseous lesions. Multilevel degenerative changes of the thoracic spine. Review of the MIP images confirms the above findings. IMPRESSION: 1. No evidence of thoracic aortic aneurysm or dissection. 2. No acute intrathoracic abnormality. 3. Partially imaged radiodensity within the left renal collecting system, which may reflect excreted contrast material. Electronically Signed   By: Limin  Xu M.D.   On: 01/18/2024 18:11   DG Chest 2 View Result Date: 01/18/2024 CLINICAL DATA:  Chest pain. EXAM: CHEST - 2 VIEW COMPARISON:  July 25, 2014. FINDINGS: The heart size and mediastinal contours are within normal limits. Both lungs are clear. The visualized skeletal structures are unremarkable. IMPRESSION: No active cardiopulmonary disease.  Electronically Signed   By: Lynwood Landy Raddle M.D.   On: 01/18/2024 15:47     Procedures   Medications Ordered in the ED  ketorolac  (TORADOL ) 15 MG/ML injection 15 mg (has no administration in time range)  fentaNYL  (SUBLIMAZE ) injection 50 mcg (50 mcg Intravenous Given 01/18/24 1722)  iohexol  (OMNIPAQUE ) 350 MG/ML injection 75 mL (75 mLs Intravenous Contrast Given 01/18/24 1742)                                    Medical Decision Making Amount and/or Complexity of Data Reviewed Labs: ordered. Radiology: ordered.  Risk Prescription drug management.   Patient with chest pain.  Anterior chest.  Left side.  Began today.  Does have radiation to the back and down the arm.  Worse with breathing.  Differential diagnosis includes causes such as musculoskeletal pain.  But also pain such as PE and aortic dissection.  Will get x-ray.  Will get  troponin.  X-ray reassuring.  Negative D-dimer.  However reviewing previous cardiac CT had a mildly elevated diameter of her aorta.  With that and pain that goes from the chest to the back we will get CT to evaluate for cause such as dissection or aneurysm.  Negative CTA.  Will treat symptomatically and discharge home for likely chest wall pain.      Final diagnoses:  Chest wall pain    ED Discharge Orders          Ordered    methocarbamol  (ROBAXIN ) 500 MG tablet  Every 8 hours PRN        01/18/24 1842               Patsey Lot, MD 01/18/24 1843

## 2024-01-18 NOTE — Discharge Instructions (Addendum)
 Motrin  or Tylenol  may also help with the pain.  The muscle relaxer can also take as needed.  Follow-up with your doctor as needed.

## 2024-01-18 NOTE — ED Triage Notes (Signed)
 Pt presents with left-sided CP, radiating down left arm, that started this am.

## 2024-01-28 ENCOUNTER — Encounter: Payer: Self-pay | Admitting: Nurse Practitioner

## 2024-01-28 ENCOUNTER — Ambulatory Visit: Attending: Nurse Practitioner | Admitting: Nurse Practitioner

## 2024-01-28 VITALS — BP 132/84 | HR 77 | Ht 67.0 in | Wt 213.4 lb

## 2024-01-28 DIAGNOSIS — I6523 Occlusion and stenosis of bilateral carotid arteries: Secondary | ICD-10-CM | POA: Diagnosis not present

## 2024-01-28 DIAGNOSIS — R072 Precordial pain: Secondary | ICD-10-CM | POA: Diagnosis not present

## 2024-01-28 DIAGNOSIS — R0609 Other forms of dyspnea: Secondary | ICD-10-CM | POA: Diagnosis not present

## 2024-01-28 DIAGNOSIS — I10 Essential (primary) hypertension: Secondary | ICD-10-CM | POA: Diagnosis not present

## 2024-01-28 DIAGNOSIS — R7303 Prediabetes: Secondary | ICD-10-CM

## 2024-01-28 DIAGNOSIS — Z87898 Personal history of other specified conditions: Secondary | ICD-10-CM

## 2024-01-28 DIAGNOSIS — Z8673 Personal history of transient ischemic attack (TIA), and cerebral infarction without residual deficits: Secondary | ICD-10-CM

## 2024-01-28 LAB — LIPID PANEL

## 2024-01-28 MED ORDER — METOPROLOL TARTRATE 100 MG PO TABS: ORAL_TABLET | ORAL | 0 refills | Status: AC

## 2024-01-28 NOTE — Progress Notes (Signed)
 Office Visit    Patient Name: Chloe Marks Date of Encounter: 01/28/2024  Primary Care Provider:  Jesus Elberta Gainer, FNP Primary Cardiologist:  Lynwood Schilling, MD  Chief Complaint    48 year old female with a history of  atypical chest pain, hypertension, bilateral carotid artery stenosis, CVA, prediabetes, partial hysterectomy, cholecystectomy, and tobacco use who presents for a new patient/heart first clinic visit to reestablish care and for follow-up related to chest pain.  Past Medical History    Past Medical History:  Diagnosis Date   Allergy    seasonal   Anemia    Anxiety    Family history of adverse reaction to anesthesia    PONV   Fibroids    Headache    Heart murmur    Hypertension    Ovarian cyst    Pre-diabetes    Stroke (HCC) 02/2010   after gall bladder surgery; has lesion of left side of brain   Past Surgical History:  Procedure Laterality Date   CHOLECYSTECTOMY     DILITATION & CURRETTAGE/HYSTROSCOPY WITH NOVASURE ABLATION N/A 03/24/2017   Procedure: DILATATION & CURETTAGE/HYSTEROSCOPY WITH NOVASURE ABLATION;  Surgeon: Jayne Vonn DEL, MD;  Location: [facility];  Service: Gynecology;  Laterality: N/A;   SALPINGOOPHORECTOMY N/A 12/15/2017   Procedure: RIGHT SALPINGO OOPHORECTOMY;  Surgeon: Jayne Vonn DEL, MD;  Location: [facility];  Service: Gynecology;  Laterality: N/A;   SUPRACERVICAL ABDOMINAL HYSTERECTOMY N/A 12/15/2017   Procedure: HYSTERECTOMY SUPRACERVICAL ABDOMINAL;  Surgeon: Jayne Vonn DEL, MD;  Location: [facility];  Service: Gynecology;  Laterality: N/A;   TUBAL LIGATION     UNILATERAL SALPINGECTOMY N/A 12/15/2017   Procedure: LEFT SALPINGECTOMY;  Surgeon: Jayne Vonn DEL, MD;  Location: [facility];  Service: Gynecology;  Laterality: N/A;    Allergies  Allergies  Allergen Reactions   Vicodin [Hydrocodone -Acetaminophen ] Nausea And Vomiting and Other (See Comments)    Dizziness    Lisinopril  Swelling    Lip swelling     Labs/Other Studies Reviewed     The following studies were reviewed today:  Cardiac Studies & Procedures   ______________________________________________________________________________________________   STRESS TESTS  EXERCISE TOLERANCE TEST (ETT) 12/20/2019  Interpretation Summary  Blood pressure demonstrated a normal response to exercise.  There was no ST segment deviation noted during stress.  Negative, adequate stress test.  Intermediate Duke Treadmill Score Risk due to chest pain with exertion.        CT SCANS  CT CARDIAC SCORING (SELF PAY ONLY) 12/05/2019  Addendum 12/05/2019  4:18 PM ADDENDUM REPORT: 12/05/2019 16:16  CLINICAL DATA:  Risk stratification  EXAM: Coronary Calcium Score  TECHNIQUE: The patient was scanned on a Siemens Sensation 16 slice scanner. Axial non-contrast 3mm slices were carried out through the heart. The data set was analyzed on a dedicated work station and scored using the Agatson method.  FINDINGS: Non-cardiac: See separate report from [facility].  Ascending Aorta: Mildly dilated, 3.9 cm  Pericardium: Normal  IMPRESSION: Coronary calcium score of 0 Agatston units. This suggests low risk for future cardiac events.  Dalton Mclean   Electronically Signed By: Ezra Shuck M.D. On: 12/05/2019 16:16  Narrative EXAM: OVER-READ INTERPRETATION  CT CHEST  The following report is an over-read performed by radiologist Dr. Toribio Aye of [facility] on 12/05/2019. This over-read does not include interpretation of cardiac or coronary anatomy or pathology. The coronary calcium score interpretation by the cardiologist is attached.  COMPARISON:  None.  FINDINGS: Within the visualized portions of the thorax  there are no suspicious appearing pulmonary nodules or masses, there is no acute consolidative airspace disease, no pleural effusions, no pneumothorax and no lymphadenopathy. Visualized portions of the upper abdomen are  unremarkable. There are no aggressive appearing lytic or blastic lesions noted in the visualized portions of the skeleton.  IMPRESSION: No significant incidental noncardiac findings are noted.  Electronically Signed: By: Toribio Aye M.D. On: 12/05/2019 09:55     ______________________________________________________________________________________________     Recent Labs: 07/17/2023: ALT 23 01/18/2024: BUN 16; Creatinine, Ser 1.18; Hemoglobin 11.5; Platelets 303; Potassium 3.4; Sodium 139  Recent Lipid Panel No results found for: CHOL, TRIG, HDL, CHOLHDL, VLDL, LDLCALC, LDLDIRECT  History of Present Illness    48 year old female with the above past medical history including atypical chest pain, hypertension, bilateral carotid artery stenosis, CVA, prediabetes, partial hysterectomy, cholecystectomy and tobacco use who presents to reestablish care and for follow-up related to chest pain.  She has a history of CVA in 2012.  She was evaluated by Dr. Lavona in 2021 in the setting of chest pain. Coronary calcium score in 11/2019 was 0, sending aorta was mildly dilated at 3.9 cm.  ETT in 11/2019 was normal.  Rotted ultrasound in 11/2019 showed 40 to 59% B ICA stenosis.  She has not been seen since.  Her brother has a history of stroke, heart failure, CKD.  No other family history of heart disease.  She presented to the ED on 01/18/2024 in the setting of left-sided chest pain, worse with certain positions and deep breaths.  Troponin was negative x 2.  Chest x-ray was unremarkable.  D-dimer was negative, however, given evidence of aortic dilation on prior CT, CT of the chest was ordered and was negative for thoracic aortic aneurysm or dissection.  She was discharged home in stable condition.  She presents today for follow-up.  Since her recent ED visit she continues to note left-sided chest discomfort that radiates to her left arm and back with intermittent left hand numbness. She  describes her chest pain as a sharp, shooting pain, when it occurs she feels that she needs to catch her breath.  Her chest/back discomfort improves with massage. She was prescribed Robaxin  for musculoskeletal pain but only took the medication once.  She also reports a 2-week history of dyspnea on exertion as well as intermittent nonpitting bilateral lower extremity edema. She denies any PND, orthopnea, weight gain.  Denies palpitations, dizziness.  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  She walks 1 mile a day approximately 2 days a week for exercise.  She denies exertional chest pain.  Home Medications    Current Outpatient Medications  Medication Sig Dispense Refill   ASHWAGANDHA GUMMIES PO Take 2 tablets by mouth every other day. [place]oli gummies     aspirin  EC 81 MG tablet Take 81 mg by mouth daily.     docusate sodium  (COLACE) 100 MG capsule Take 100 mg by mouth daily.     ferrous sulfate 325 (65 FE) MG EC tablet Take 325 mg by mouth 2 (two) times daily.     losartan-hydrochlorothiazide  (HYZAAR) 100-12.5 MG tablet Take 1 tablet by mouth daily.     methocarbamol  (ROBAXIN ) 500 MG tablet Take 1 tablet (500 mg total) by mouth every 8 (eight) hours as needed. 8 tablet 0   No current facility-administered medications for this visit.     Review of Systems    She denies chest pain,  palpitations, dyspnea, pnd, orthopnea, n, v, dizziness, syncope, edema, weight gain, or early satiety. All other systems reviewed and are otherwise negative except as noted above.   Physical Exam    VS:  BP 132/84 (BP Location: Left Arm, Patient Position: Sitting)   Pulse 77   Ht 5' 7 (1.702 m)   Wt 213 lb 6.4 oz (96.8 kg)   LMP 04/11/2017 (Approximate) Comment: continuous period for 6 months  SpO2 100%   BMI 33.42 kg/m  GEN: Well nourished, well developed, in no acute distress. HEENT: normal. Neck: Supple, no JVD, carotid bruits, or  masses. Cardiac: RRR, no murmurs, rubs, or gallops. No clubbing, cyanosis, edema.  Radials/DP/PT 2+ and equal bilaterally.  Respiratory:  Respirations regular and unlabored, clear to auscultation bilaterally. GI: Soft, nontender, nondistended, BS + x 4. MS: no deformity or atrophy. Skin: warm and dry, no rash. Neuro:  Strength and sensation are intact. Psych: Normal affect.  Accessory Clinical Findings    ECG personally reviewed by me today -    - no EKG in office today.  EKG reviewed from 01/18/2024, NSR, 70 bpm, no acute ST/T wave changes. Lab Results  Component Value Date   WBC 6.3 01/18/2024   HGB 11.5 (L) 01/18/2024   HCT 37.5 01/18/2024   MCV 73.5 (L) 01/18/2024   PLT 303 01/18/2024   Lab Results  Component Value Date   CREATININE 1.18 (H) 01/18/2024   BUN 16 01/18/2024   NA 139 01/18/2024   K 3.4 (L) 01/18/2024   CL 105 01/18/2024   CO2 27 01/18/2024   Lab Results  Component Value Date   ALT 23 07/17/2023   AST 41 07/17/2023   ALKPHOS 54 07/17/2023   BILITOT 0.4 07/17/2023   No results found for: CHOL, HDL, LDLCALC, LDLDIRECT, TRIG, CHOLHDL  Lab Results  Component Value Date   HGBA1C 5.7 (H) 12/10/2017    Assessment & Plan    1. Chest pain/dyspnea on exertion: Coronary calcium score in 11/2019 was 0, ascending aorta was mildly dilated at 3.9 cm (not appreciated on CT chest).  ETT in 11/2019 was normal.  Recent ED visit in the setting of chest pain.  Workup was unremarkable.  She continues to report left-sided chest discomfort that radiates to her left arm and back with intermittent left hand numbness. She describes her chest pain as a sharp, shooting pain, when it occurs she feels that she needs to catch her breath.  Her chest/back discomfort improves with massage. She was prescribed Robaxin  for musculoskeletal pain but only took the medication once.  She also reports a 2-week history of dyspnea on exertion as well as intermittent nonpitting bilateral lower  extremity edema.  Euvolemic and well compensated on exam.  Symptoms are overall atypical, suspect musculoskeletal origin.  However, through shared decision making, will rule out cardiac etiology.  Will pursue coronary CT angiogram, will check echocardiogram in the setting of exertional dyspnea.  Reviewed ED precautions.  Will update CMET today.  She will take metoprolol  100 mg prior to CT.  I also encouraged her to try taking her muscle relaxer to see if this improves her symptoms.  2. Hypertension: BP well controlled. Continue current antihypertensive regimen.   3. Carotid artery stenosis: Carotid ultrasound in 2021 showed 40 to 59% B ICA stenosis.  Asymptomatic.  Will update carotid ultrasound.  Continue aspirin .  Consider initiation of statin therapy at follow-up.  4. History of CVA: No recent LDL on file.  Will update fasting lipid panel.  She would likely benefit from statin therapy, can revisit at follow-up.  5. Prediabetes: No recent A1c on file.  Will update.  Management per PCP.  6. History of snoring: She reports daytime fatigue, history of snoring.  She works nights.  We discussed possible sleep study, she declines at this time, can revisit at follow-up.  7. Disposition: Follow-up in 6 to 8 weeks.       Damien JAYSON Braver, NP 01/28/2024, 9:33 AM

## 2024-01-28 NOTE — Patient Instructions (Signed)
 Medication Instructions:  METOPROLOL TARTRATE 100 MG TAKE ONE TABLET TWO HOURS PRIOR TO CARDIAC CT *If you need a refill on your cardiac medications before your next appointment, please call your pharmacy*  Lab Work: TODAY CMET, A1C, FASTING LIPIDs If you have labs (blood work) drawn today and your tests are completely normal, you will receive your results only by: MyChart Message (if you have MyChart) OR A paper copy in the mail If you have any lab test that is abnormal or we need to change your treatment, we will call you to review the results.  Testing/Procedures:  Your physician has requested that you have a carotid duplex. This test is an ultrasound of the carotid arteries in your neck. It looks at blood flow through these arteries that supply the brain with blood. Allow one hour for this exam. There are no restrictions or special instructions.   Your physician has requested that you have an echocardiogram. Echocardiography is a painless test that uses sound waves to create images of your heart. It provides your doctor with information about the size and shape of your heart and how well your heart's chambers and valves are working. This procedure takes approximately one hour. There are no restrictions for this procedure. Please do NOT wear cologne, perfume, aftershave, or lotions (deodorant is allowed). Please arrive 15 minutes prior to your appointment time.  Please note: We ask at that you not bring children with you during ultrasound (echo/ vascular) testing. Due to room size and safety concerns, children are not allowed in the ultrasound rooms during exams. Our front office staff cannot provide observation of children in our lobby area while testing is being conducted. An adult accompanying a patient to their appointment will only be allowed in the ultrasound room at the discretion of the ultrasound technician under special circumstances. We apologize for any inconvenience.    Your  cardiac CT will be scheduled at one of the below locations:    [facility] [address]  [place], [place] [address]  If scheduled at the [facility] at [address], please enter the parking lot using the [address] entrance and use the FREE valet service at the patient drop-off area. Enter the buidling and check-in with registration on the main floor.  Please follow these instructions carefully (unless otherwise directed):  An IV will be required for this test and Nitroglycerin will be given.  Hold all erectile dysfunction medications at least 3 days (72 hrs) prior to test. (Ie viagra, cialis, sildenafil, tadalafil, etc)   On the Night Before the Test: Be sure to Drink plenty of water. Do not consume any caffeinated/decaffeinated beverages or chocolate 12 hours prior to your test. Do not take any antihistamines 12 hours prior to your test. If the patient has contrast allergy: Patient will need a prescription for Prednisone and very clear instructions (as follows): Prednisone 50 mg - take 13 hours prior to test Take another Prednisone 50 mg 7 hours prior to test Take another Prednisone 50 mg 1 hour prior to test Take Benadryl  50 mg 1 hour prior to test Patient must complete all four doses of above prophylactic medications. Patient will need a ride after test due to Benadryl .  On the Day of the Test: Drink plenty of water until 1 hour prior to the test. Do not eat any food 1 hour prior to test. You may take your regular medications prior to the test.  Take metoprolol (Lopressor)  two hours prior to test. If you take Furosemide/Hydrochlorothiazide /Spironolactone/Chlorthalidone, please HOLD on the morning of the test. Patients who wear a continuous glucose monitor MUST remove the device prior to scanning. FEMALES- please wear underwire-free bra if available, avoid dresses & tight clothing  *After the Test: Drink plenty of  water. After receiving IV contrast, you may experience a mild flushed feeling. This is normal. On occasion, you may experience a mild rash up to 24 hours after the test. This is not dangerous. If this occurs, you can take Benadryl  25 mg, Zyrtec, Claritin , or Allegra and increase your fluid intake. (Patients taking Tikosyn should avoid Benadryl , and may take Zyrtec, Claritin , or Allegra) If you experience trouble breathing, this can be serious. If it is severe call 911 IMMEDIATELY. If it is mild, please call our office.  We will call to schedule your test 2-4 weeks out understanding that some insurance companies will need an authorization prior to the service being performed.   For more information and frequently asked questions, please visit our website : [email or web address]  For non-scheduling related questions, please contact the cardiac imaging nurse navigator should you have any questions/concerns: Cardiac Imaging Nurse Navigators Direct Office Dial: [phone]   For scheduling needs, including cancellations and rescheduling, please call [place], [phone].   Follow-Up: At [facility], you and your health needs are our priority.  As part of our continuing mission to provide you with exceptional heart care, our providers are all part of one team.  This team includes your primary Cardiologist (physician) and Advanced Practice Providers or APPs (Physician Assistants and Nurse Practitioners) who all work together to provide you with the care you need, when you need it.  Your next appointment:   6-8 week(s)  Provider:   Damien Braver, NP

## 2024-01-29 LAB — LIPID PANEL
Cholesterol, Total: 199 mg/dL (ref 100–199)
HDL: 58 mg/dL (ref >39)
LDL CALC COMMENT:: 3.4 ratio (ref 0.0–4.4)
LDL Chol Calc (NIH): 128 mg/dL — AB (ref 0–99)
Triglycerides: 74 mg/dL (ref 0–149)
VLDL Cholesterol Cal: 13 mg/dL (ref 5–40)

## 2024-01-29 LAB — COMPREHENSIVE METABOLIC PANEL WITH GFR
ALT: 19 IU/L (ref 0–32)
AST: 35 IU/L (ref 0–40)
Albumin: 4.4 g/dL (ref 3.9–4.9)
Alkaline Phosphatase: 66 IU/L (ref 44–121)
BUN/Creatinine Ratio: 12 (ref 9–23)
BUN: 11 mg/dL (ref 6–24)
Bilirubin Total: 0.2 mg/dL (ref 0.0–1.2)
CO2: 23 mmol/L (ref 20–29)
Calcium: 9.1 mg/dL (ref 8.7–10.2)
Chloride: 103 mmol/L (ref 96–106)
Creatinine, Ser: 0.92 mg/dL (ref 0.57–1.00)
Globulin, Total: 2.5 g/dL (ref 1.5–4.5)
Glucose: 79 mg/dL (ref 70–99)
Potassium: 4.1 mmol/L (ref 3.5–5.2)
Sodium: 142 mmol/L (ref 134–144)
Total Protein: 6.9 g/dL (ref 6.0–8.5)
eGFR: 77 mL/min/1.73 (ref >59)

## 2024-01-29 LAB — HEMOGLOBIN A1C
Est. average glucose Bld gHb Est-mCnc: 128 mg/dL
Hgb A1c MFr Bld: 6.1 % — ABNORMAL HIGH (ref 4.8–5.6)

## 2024-01-31 ENCOUNTER — Encounter: Payer: Self-pay | Admitting: Nurse Practitioner

## 2024-02-02 ENCOUNTER — Ambulatory Visit: Payer: Self-pay | Admitting: Nurse Practitioner

## 2024-02-03 ENCOUNTER — Encounter (HOSPITAL_COMMUNITY): Payer: Self-pay

## 2024-02-04 ENCOUNTER — Telehealth: Payer: Self-pay

## 2024-02-04 DIAGNOSIS — R0609 Other forms of dyspnea: Secondary | ICD-10-CM

## 2024-02-04 DIAGNOSIS — R072 Precordial pain: Secondary | ICD-10-CM

## 2024-02-04 NOTE — Telephone Encounter (Signed)
 Cardiac CT canceled due to not being covered under insurance. ETT testing ordered per Damien Braver NP. Please arrange Exercise Tolerance Test. Thank you.

## 2024-02-08 ENCOUNTER — Ambulatory Visit (HOSPITAL_COMMUNITY)

## 2024-02-16 ENCOUNTER — Ambulatory Visit (HOSPITAL_BASED_OUTPATIENT_CLINIC_OR_DEPARTMENT_OTHER)
Admission: RE | Admit: 2024-02-16 | Discharge: 2024-02-16 | Disposition: A | Discharge status: Home / Self Care | Source: Ambulatory Visit | Attending: Nurse Practitioner | Admitting: Nurse Practitioner

## 2024-02-16 ENCOUNTER — Ambulatory Visit (HOSPITAL_COMMUNITY)
Admission: RE | Admit: 2024-02-16 | Discharge: 2024-02-16 | Disposition: A | Discharge status: Home / Self Care | Source: Ambulatory Visit | Attending: Nurse Practitioner | Admitting: Nurse Practitioner

## 2024-02-16 DIAGNOSIS — I6523 Occlusion and stenosis of bilateral carotid arteries: Secondary | ICD-10-CM | POA: Insufficient documentation

## 2024-02-16 DIAGNOSIS — R0609 Other forms of dyspnea: Secondary | ICD-10-CM

## 2024-02-16 LAB — ECHOCARDIOGRAM COMPLETE
AR max vel: 1.82 cm2
AV Area VTI: 1.97 cm2
AV Area mean vel: 1.89 cm2
AV Mean grad: 4 mmHg
AV Peak grad: 8.2 mmHg
Ao pk vel: 1.43 m/s
Area-P 1/2: 3.27 cm2
S' Lateral: 3.58 cm

## 2024-02-17 ENCOUNTER — Other Ambulatory Visit: Payer: Self-pay

## 2024-02-17 DIAGNOSIS — Z79899 Other long term (current) drug therapy: Secondary | ICD-10-CM

## 2024-02-17 DIAGNOSIS — I10 Essential (primary) hypertension: Secondary | ICD-10-CM

## 2024-02-17 MED ORDER — ROSUVASTATIN CALCIUM 10 MG PO TABS: 10.0000 mg | ORAL_TABLET | Freq: Every day | ORAL | 3 refills | Status: AC

## 2024-02-25 ENCOUNTER — Encounter (HOSPITAL_COMMUNITY): Payer: Self-pay | Admitting: *Deleted

## 2024-03-07 ENCOUNTER — Telehealth (HOSPITAL_COMMUNITY): Payer: Self-pay | Admitting: Nurse Practitioner

## 2024-03-07 NOTE — Telephone Encounter (Signed)
 Noted

## 2024-03-07 NOTE — Telephone Encounter (Signed)
 Patient called and cancelled GXT and did not wish to reschedule at this time. Order will be removed from the WQ. If patient calls back we will reinstate the order. Thank you.

## 2024-03-10 ENCOUNTER — Ambulatory Visit (HOSPITAL_COMMUNITY)

## 2024-03-17 ENCOUNTER — Ambulatory Visit: Admitting: Nurse Practitioner
